# Patient Record
Sex: Female | Born: 1955 | Race: White | Hispanic: No | Marital: Married | Smoking: Never smoker
Health system: Southern US, Community
[De-identification: ages and names within clinical notes are randomized; demographics above are authoritative.]

## PROBLEM LIST (undated history)

## (undated) DIAGNOSIS — I456 Pre-excitation syndrome: Secondary | ICD-10-CM

## (undated) DIAGNOSIS — R450 Nervousness: Secondary | ICD-10-CM

## (undated) DIAGNOSIS — R251 Tremor, unspecified: Secondary | ICD-10-CM

## (undated) DIAGNOSIS — G259 Extrapyramidal and movement disorder, unspecified: Secondary | ICD-10-CM

## (undated) DIAGNOSIS — M21372 Foot drop, left foot: Secondary | ICD-10-CM

## (undated) DIAGNOSIS — F419 Anxiety disorder, unspecified: Secondary | ICD-10-CM

## (undated) HISTORY — DX: Anxiety disorder, unspecified: F41.9

## (undated) HISTORY — DX: Nervousness: R45.0

## (undated) HISTORY — PX: FACIAL COSMETIC SURGERY: SHX629

## (undated) HISTORY — PX: TONSILLECTOMY: SUR1361

## (undated) HISTORY — DX: Pre-excitation syndrome: I45.6

## (undated) HISTORY — DX: Foot drop, left foot: M21.372

## (undated) HISTORY — DX: Extrapyramidal and movement disorder, unspecified: G25.9

## (undated) HISTORY — DX: Tremor, unspecified: R25.1

---

## 1999-12-10 ENCOUNTER — Other Ambulatory Visit: Admission: RE | Admit: 1999-12-10 | Discharge: 1999-12-10 | Payer: Self-pay | Admitting: Gynecology

## 2003-02-20 ENCOUNTER — Other Ambulatory Visit: Admission: RE | Admit: 2003-02-20 | Discharge: 2003-02-20 | Payer: Self-pay | Admitting: Gynecology

## 2004-03-02 ENCOUNTER — Other Ambulatory Visit: Admission: RE | Admit: 2004-03-02 | Discharge: 2004-03-02 | Payer: Self-pay | Admitting: Gynecology

## 2005-03-15 ENCOUNTER — Other Ambulatory Visit: Admission: RE | Admit: 2005-03-15 | Discharge: 2005-03-15 | Payer: Self-pay | Admitting: Gynecology

## 2006-03-28 ENCOUNTER — Other Ambulatory Visit: Admission: RE | Admit: 2006-03-28 | Discharge: 2006-03-28 | Payer: Self-pay | Admitting: Gynecology

## 2007-02-08 ENCOUNTER — Ambulatory Visit: Payer: Self-pay | Admitting: Internal Medicine

## 2007-02-22 ENCOUNTER — Ambulatory Visit: Payer: Self-pay | Admitting: Internal Medicine

## 2011-04-28 ENCOUNTER — Other Ambulatory Visit (HOSPITAL_COMMUNITY)
Admission: RE | Admit: 2011-04-28 | Discharge: 2011-04-28 | Disposition: A | Discharge status: Home / Self Care | Payer: BC Managed Care – PPO | Source: Ambulatory Visit | Attending: Radiology | Admitting: Radiology

## 2011-04-28 DIAGNOSIS — N6009 Solitary cyst of unspecified breast: Secondary | ICD-10-CM | POA: Insufficient documentation

## 2012-06-10 ENCOUNTER — Encounter (HOSPITAL_BASED_OUTPATIENT_CLINIC_OR_DEPARTMENT_OTHER): Payer: Self-pay | Admitting: *Deleted

## 2012-06-10 ENCOUNTER — Emergency Department (HOSPITAL_BASED_OUTPATIENT_CLINIC_OR_DEPARTMENT_OTHER): Payer: BC Managed Care – PPO

## 2012-06-10 ENCOUNTER — Emergency Department (HOSPITAL_BASED_OUTPATIENT_CLINIC_OR_DEPARTMENT_OTHER)
Admission: EM | Admit: 2012-06-10 | Discharge: 2012-06-10 | Disposition: A | Discharge status: Home / Self Care | Payer: BC Managed Care – PPO | Attending: Emergency Medicine | Admitting: Emergency Medicine

## 2012-06-10 DIAGNOSIS — M25529 Pain in unspecified elbow: Secondary | ICD-10-CM

## 2012-06-10 DIAGNOSIS — Z7982 Long term (current) use of aspirin: Secondary | ICD-10-CM | POA: Insufficient documentation

## 2012-06-10 DIAGNOSIS — Y9229 Other specified public building as the place of occurrence of the external cause: Secondary | ICD-10-CM | POA: Insufficient documentation

## 2012-06-10 DIAGNOSIS — S60229A Contusion of unspecified hand, initial encounter: Secondary | ICD-10-CM | POA: Insufficient documentation

## 2012-06-10 DIAGNOSIS — W19XXXA Unspecified fall, initial encounter: Secondary | ICD-10-CM | POA: Insufficient documentation

## 2012-06-10 MED ORDER — SODIUM CHLORIDE 0.9 % IV BOLUS (SEPSIS): 500.0000 mL | Freq: Once | INTRAVENOUS | Status: DC

## 2012-06-10 MED ORDER — OXYCODONE-ACETAMINOPHEN 5-325 MG PO TABS: 1.0000 | ORAL_TABLET | ORAL | Status: AC | PRN

## 2012-06-10 MED ORDER — IBUPROFEN 800 MG PO TABS
800.0000 mg | ORAL_TABLET | Freq: Once | ORAL | Status: AC
  Administered 2012-06-10: 800 mg via ORAL
  Filled 2012-06-10: qty 1

## 2012-06-10 NOTE — ED Notes (Signed)
Pt states she was wearing a different pair of shoes and fell. C/O pain to left elbow and lip. Rings removed and given to husband. Ice pack applied. +radial pulse. Moves fingers. Feels touch. Cap refill < 3 sec

## 2012-06-10 NOTE — ED Provider Notes (Signed)
History   This chart was scribed for Pamela Quarry, MD scribed by Magnus Sinning. The patient was seen in room MH08/MH08 seen at 16:50   CSN: [id number]  Arrival date & time 06/10/12  1606   None     Chief Complaint  Patient presents with  . Fall    (Consider location/radiation/quality/duration/timing/severity/associated sxs/prior treatment) HPI Pamela Khan is a 56 y.o. female who presents to the Emergency Department complaining of persistent moderate left elbow pain, as a result of fall that occurred at [employer] approximately 1 hour ago. She says she was walking when she fell, hitting her chin and mouth. She states she has not taken any medication, besides a Xanax today. Reports normal bite and denies LOC,head injury, chest tenderness, abd tenderness, ambulatory difficulties,or neck pain  History reviewed. No pertinent past medical history.  History reviewed. No pertinent past surgical history.  History reviewed. No pertinent family history.  History  Substance Use Topics  . Smoking status: Never Smoker   . Smokeless tobacco: Not on file  . Alcohol Use: Yes   Review of Systems  HENT: Negative for neck pain.   Cardiovascular: Negative for chest pain.  Gastrointestinal: Negative for abdominal pain.  Musculoskeletal: Negative for gait problem.  Neurological: Negative for headaches.  All other systems reviewed and are negative.    Allergies  Review of patient's allergies indicates no known allergies.  Home Medications   Current Outpatient Rx  Name Route Sig Dispense Refill  . ACETAMINOPHEN 325 MG PO TABS Oral Take 650 mg by mouth every 6 (six) hours as needed. For headache    . ASPIRIN 81 MG PO TABS Oral Take 81 mg by mouth daily.    Marland Kitchen LORAZEPAM PO Oral Take 1 tablet by mouth 2 (two) times daily as needed. For anxiety      BP 123/74  Pulse 84  Temp 98 F (36.7 C) (Oral)  Resp 20  Ht 5\' 5"  (1.651 m)  Wt 130 lb (58.968 kg)  BMI 21.63 kg/m2  SpO2  98%  Physical Exam  Nursing note and vitals reviewed. Constitutional: She is oriented to person, place, and time. She appears well-developed and well-nourished. No distress.  HENT:  Head: Normocephalic and atraumatic.       Tooth not tender over alveolar ridge above it. No hole. Tooth nml. Little abrasion on chin. No blood or trauma behind ears or eardrum. No contusions over mastoid.   Eyes: EOM are normal. Pupils are equal, round, and reactive to light.  Neck: Neck supple. No tracheal deviation present.       Neck Non-tender  Cardiovascular: Normal rate.   Pulmonary/Chest: Effort normal. No respiratory distress. She exhibits no tenderness.  Abdominal: Soft. She exhibits no distension. There is no tenderness.  Musculoskeletal: Normal range of motion. She exhibits no edema.       No tenderness over left elbow. Tenderness spot on distal humerus lateral aspect. Full active ROM of left elbow  Neurological: She is alert and oriented to person, place, and time. No sensory deficit.  Skin: Skin is warm and dry.  Psychiatric: She has a normal mood and affect. Her behavior is normal.    ED Course  Procedures (including critical care time) DIAGNOSTIC STUDIES: Oxygen Saturation is 98% on room air, normal by my interpretation.    COORDINATION OF CARE:  Dg Elbow Complete Left  06/10/2012  *RADIOLOGY REPORT*  Clinical Data: Slipped and fell injuring left elbow and left humerus  LEFT ELBOW -  COMPLETE 3+ VIEW  Comparison: None  Findings: Joint spaces preserved. Osseous mineralization grossly normal. No acute fracture, dislocation or bone destruction. Elbow joint effusion.  IMPRESSION: No acute osseous abnormalities identified.  Original Report Authenticated By: Lollie Marrow, M.D.   Dg Humerus Left  06/10/2012  *RADIOLOGY REPORT*  Clinical Data: Slipped, fell, injuring left elbow and humerus, proximal humeral pain  LEFT HUMERUS - 2+ VIEW  Comparison: No  Findings: AC joint alignment normal. Scattered  artifacts. No definite fracture, dislocation or bone destruction.  IMPRESSION: No acute osseous abnormalities.  Original Report Authenticated By: Lollie Marrow, M.D.     No diagnosis found.    MDM  I personally performed the services described in this documentation, which was scribed in my presence. The recorded information has been reviewed and considered.         Pamela Quarry, MD 06/11/12 [phone]

## 2012-06-10 NOTE — Discharge Instructions (Signed)
Abrasions Abrasions are skin scrapes. Their treatment depends on how large and deep the abrasion is. Abrasions do not extend through all layers of the skin. A cut or lesion through all skin layers is called a laceration. HOME CARE INSTRUCTIONS   If you were given a dressing, change it at least once a day or as instructed by your caregiver. If the bandage sticks, soak it off with a solution of water or hydrogen peroxide.   Twice a day, wash the area with soap and water to remove all the cream/ointment. You may do this in a sink, under a tub faucet, or in a shower. Rinse off the soap and pat dry with a clean towel. Look for signs of infection (see below).   Reapply cream/ointment according to your caregiver's instruction. This will help prevent infection and keep the bandage from sticking. Telfa or gauze over the wound and under the dressing or wrap will also help keep the bandage from sticking.   If the bandage becomes wet, dirty, or develops a foul smell, change it as soon as possible.   Only take over-the-counter or prescription medicines for pain, discomfort, or fever as directed by your caregiver.  SEEK IMMEDIATE MEDICAL CARE IF:   Increasing pain in the wound.   Signs of infection develop: redness, swelling, surrounding area is tender to touch, or pus coming from the wound.   You have a fever.   Any foul smell coming from the wound or dressing.  Most skin wounds heal within ten days. Facial wounds heal faster. However, an infection may occur despite proper treatment. You should have the wound checked for signs of infection within 24 to 48 hours or sooner if problems arise. If you were not given a wound-check appointment, look closely at the wound yourself on the second day for early signs of infection listed above. MAKE SURE YOU:   Understand these instructions.   Will watch your condition.   Will get help right away if you are not doing well or get worse.  Document Released:  09/08/2005 Document Revised: 11/18/2011 Document Reviewed: 11/02/2011 [employer] Patient Information 2012 [employer].Contusion A contusion is a deep bruise. Contusions are the result of an injury that caused bleeding under the skin. The contusion may turn blue, purple, or yellow. Minor injuries will give you a painless contusion, but more severe contusions may stay painful and swollen for a few weeks.  CAUSES  A contusion is usually caused by a blow, trauma, or direct force to an area of the body. SYMPTOMS   Swelling and redness of the injured area.   Bruising of the injured area.   Tenderness and soreness of the injured area.   Pain.  DIAGNOSIS  The diagnosis can be made by taking a history and physical exam. An X-Kamaree Berkel, CT scan, or MRI may be needed to determine if there were any associated injuries, such as fractures. TREATMENT  Specific treatment will depend on what area of the body was injured. In general, the best treatment for a contusion is resting, icing, elevating, and applying cold compresses to the injured area. Over-the-counter medicines may also be recommended for pain control. Ask your caregiver what the best treatment is for your contusion. HOME CARE INSTRUCTIONS   Put ice on the injured area.   Put ice in a plastic bag.   Place a towel between your skin and the bag.   Leave the ice on for 15 to 20 minutes, 3 to 4 times a day.     Only take over-the-counter or prescription medicines for pain, discomfort, or fever as directed by your caregiver. Your caregiver may recommend avoiding anti-inflammatory medicines (aspirin, ibuprofen, and naproxen) for 48 hours because these medicines may increase bruising.   Rest the injured area.   If possible, elevate the injured area to reduce swelling.  SEEK IMMEDIATE MEDICAL CARE IF:   You have increased bruising or swelling.   You have pain that is getting worse.   Your swelling or pain is not relieved with medicines.  MAKE  SURE YOU:   Understand these instructions.   Will watch your condition.   Will get help right away if you are not doing well or get worse.  Document Released: 09/08/2005 Document Revised: 11/18/2011 Document Reviewed: 10/04/2011 Exit[employer] Patient Information 2012 [employer]. 

## 2012-08-04 ENCOUNTER — Encounter: Payer: Self-pay | Admitting: Cardiovascular Disease

## 2012-08-08 ENCOUNTER — Encounter: Payer: Self-pay | Admitting: Cardiovascular Disease

## 2012-08-24 ENCOUNTER — Encounter: Payer: Self-pay | Admitting: *Deleted

## 2012-08-24 ENCOUNTER — Ambulatory Visit (INDEPENDENT_AMBULATORY_CARE_PROVIDER_SITE_OTHER): Payer: BC Managed Care – PPO | Admitting: Cardiovascular Disease

## 2012-08-24 ENCOUNTER — Encounter: Payer: Self-pay | Admitting: Cardiovascular Disease

## 2012-08-24 VITALS — BP 134/79 | HR 69 | Wt 137.0 lb

## 2012-08-24 DIAGNOSIS — R450 Nervousness: Secondary | ICD-10-CM | POA: Insufficient documentation

## 2012-08-24 DIAGNOSIS — F411 Generalized anxiety disorder: Secondary | ICD-10-CM

## 2012-08-24 DIAGNOSIS — F419 Anxiety disorder, unspecified: Secondary | ICD-10-CM | POA: Insufficient documentation

## 2012-08-24 DIAGNOSIS — I456 Pre-excitation syndrome: Secondary | ICD-10-CM | POA: Insufficient documentation

## 2012-08-24 NOTE — Assessment & Plan Note (Signed)
F/U echo to R/O associated structural heart disease.  F/U ETT to see how robust pathway is.  Opinion from EP but since asymptomatic would not Rx.

## 2012-08-24 NOTE — Patient Instructions (Addendum)
Your physician wants you to follow-up in:   YEAR WITH DR Haywood Filler will receive a reminder letter in the mail two months in advance. If you don't receive a letter, please call our office to schedule the follow-up appointment.  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 Please refer to the Current Medication list given to you today. Your physician has requested that you have an exercise tolerance test. For further information please visit [email or web address] Please also follow instruction sheet, as given.  DX WPW Your physician has requested that you have an echocardiogram. Echocardiography is a painless test that uses sound waves to create images of your heart. It provides your doctor with information about the size and shape of your heart and how well your heart's chambers and valves are working. This procedure takes approximately one hour. There are no restrictions for this procedure. DX CHECK HEART STRUCTURE   AND WPW

## 2012-08-24 NOTE — Progress Notes (Signed)
Patient ID: Pamela Khan, female   DOB: [birth year], 56 y.o.   MRN: [id number] 56 yo referred by Dr Riley Nearing for abnormal ECG.  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  Patient does not recall having a prior ECG.  She is asymptomatic with no palpitations or syncope.  No family history.  She walks , uses the elliptical and does zumba with no chest pain or dyspnea  Nonsmoker and has a couple of drinks per week.  History of depression but not on sympathomimetic drug.    ROS: Denies fever, malais, weight loss, blurry vision, decreased visual acuity, cough, sputum, SOB, hemoptysis, pleuritic pain, palpitaitons, heartburn, abdominal pain, melena, lower extremity edema, claudication, or rash.  All other systems reviewed and negative   General: Affect appropriate Healthy:  appears stated age HEENT: normal Neck supple with no adenopathy JVP normal no bruits no thyromegaly Lungs clear with no wheezing and good diaphragmatic motion Heart:  S1/S2 no murmur,rub, gallop or click PMI normal Abdomen: benighn, BS positve, no tenderness, no AAA no bruit.  No HSM or HJR Distal pulses intact with no bruits No edema Neuro non-focal Skin warm and dry No muscular weakness  Medications Current Outpatient Prescriptions  Medication Sig Dispense Refill  . acetaminophen (TYLENOL) 325 MG tablet Take 650 mg by mouth every 6 (six) hours as needed. For headache      . aspirin 81 MG tablet Take 81 mg by mouth daily.      Marland Kitchen FLUoxetine (PROZAC) 10 MG capsule Take 10 mg by mouth daily.       Marland Kitchen LORazepam (ATIVAN) 1 MG tablet Take 1 mg by mouth daily as needed. For anxiety        Allergies Review of patient's allergies indicates no known allergies.  Family History: Family History  Problem Relation Age of Onset  . Mental illness    . Heart disease    . Hyperlipidemia      Social History: History   Social History  . Marital Status: Married    Spouse Name: N/A    Number of  Children: N/A  . Years of Education: N/A   Occupational History  . Not on file.   Social History Main Topics  . Smoking status: Never Smoker   . Smokeless tobacco: Not on file  . Alcohol Use: Yes  . Drug Use: No  . Sexually Active:    Other Topics Concern  . Not on file   Social History Narrative  . 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

## 2012-08-24 NOTE — Assessment & Plan Note (Signed)
PRN xanax.  Avoid antidepressents with sympathomimetic activity

## 2012-08-29 ENCOUNTER — Other Ambulatory Visit (HOSPITAL_COMMUNITY): Payer: BC Managed Care – PPO

## 2012-08-31 ENCOUNTER — Ambulatory Visit (HOSPITAL_COMMUNITY): Payer: BC Managed Care – PPO | Attending: Cardiovascular Disease | Admitting: Radiology

## 2012-08-31 DIAGNOSIS — I369 Nonrheumatic tricuspid valve disorder, unspecified: Secondary | ICD-10-CM | POA: Insufficient documentation

## 2012-08-31 DIAGNOSIS — R9431 Abnormal electrocardiogram [ECG] [EKG]: Secondary | ICD-10-CM

## 2012-08-31 DIAGNOSIS — I456 Pre-excitation syndrome: Secondary | ICD-10-CM

## 2012-08-31 NOTE — Progress Notes (Signed)
Echocardiogram performed.  

## 2012-09-05 ENCOUNTER — Encounter: Payer: BC Managed Care – PPO | Admitting: Physician Assistant

## 2012-09-11 ENCOUNTER — Encounter: Payer: Self-pay | Admitting: Physician Assistant

## 2012-09-11 ENCOUNTER — Ambulatory Visit (INDEPENDENT_AMBULATORY_CARE_PROVIDER_SITE_OTHER): Payer: BC Managed Care – PPO | Admitting: Physician Assistant

## 2012-09-11 DIAGNOSIS — I456 Pre-excitation syndrome: Secondary | ICD-10-CM

## 2012-09-11 NOTE — Procedures (Signed)
Exercise Treadmill Test  Pre-Exercise Testing Evaluation Rhythm: normal sinus  Rate: 64   PR:  .10 QRS:  .10  QT:  .41 QTc: .42     Test  Exercise Tolerance Test Ordering MD: Charlton Haws, MD  Interpreting MD: Tereso Newcomer , PA-C  Unique Test No: 1  Treadmill:  1  Indication for ETT: WPW  Contraindication to ETT: No   Stress Modality: exercise - treadmill  Cardiac Imaging Performed: non   Protocol: standard Bruce - maximal  Max BP:  166/70  Max MPHR (bpm):  164 85% MPR (bpm):  139  MPHR obtained (bpm):  166 % MPHR obtained:  101%  Reached 85% MPHR (min:sec):  7:25 Total Exercise Time (min-sec):  9:46  Workload in METS:  11.3 Borg Scale: 15  Reason ETT Terminated:  patient's desire to stop    ST Segment Analysis At Rest: normal ST segments - no evidence of significant ST depression With Exercise: non-specific ST changes  Other Information Arrhythmia:  No Angina during ETT:  absent (0) Quality of ETT:  diagnostic  ETT Interpretation:  normal - no evidence of ischemia by ST analysis  Comments: Good exercise tolerance. No chest pain. Normal BP response to exercise. No ST-T changes to suggest ischemia.  Delta wave resolves in most leads (where evident) when HR approaches 90-100 or higer.  It did persist in lead 1 (?significance).  Recommendations: Follow up with Dr. Charlton Haws as directed. She has appt with EP pending. Tereso Newcomer, PA-C  12:11 PM 09/11/2012

## 2012-09-20 ENCOUNTER — Institutional Professional Consult (permissible substitution): Payer: BC Managed Care – PPO | Admitting: Cardiology

## 2012-10-02 ENCOUNTER — Ambulatory Visit (INDEPENDENT_AMBULATORY_CARE_PROVIDER_SITE_OTHER): Payer: BC Managed Care – PPO | Admitting: Internal Medicine

## 2012-10-02 ENCOUNTER — Encounter: Payer: Self-pay | Admitting: Internal Medicine

## 2012-10-02 VITALS — BP 108/72 | HR 70 | Ht 65.0 in | Wt 139.0 lb

## 2012-10-02 DIAGNOSIS — I456 Pre-excitation syndrome: Secondary | ICD-10-CM

## 2012-10-02 NOTE — Patient Instructions (Addendum)
Your physician recommends that you schedule a follow-up appointment as needed  

## 2012-10-09 ENCOUNTER — Encounter: Payer: Self-pay | Admitting: Internal Medicine

## 2012-10-09 NOTE — Progress Notes (Signed)
   Primary Care Physician: Angelica Chessman., MD Referring Physician:  Dr Johney Maine is a 56 y.o. female with a h/o recently discovered pre-excitation in EKG who presents today for EP consultation.  She has been evaluated by Dr Eden Emms.  She underwent a GXT which revealed that her pre-excitation resolved with exercise (HR over 100 bpm).  She exercises regularly without difficulty. Today, she denies symptoms of palpitations, chest pain, shortness of breath, orthopnea, PND, lower extremity edema, dizziness, presyncope, syncope, or neurologic sequela. The patient is tolerating medications without difficulties and is otherwise without complaint today.   Past Medical History  Diagnosis Date  . Nervous   . Anxiety   . Ventricular pre-excitation    Past Surgical History  Procedure Date  . Tonsillectomy   . Facial cosmetic surgery   . Cesarean section     Current Outpatient Prescriptions  Medication Sig Dispense Refill  . acetaminophen (TYLENOL) 325 MG tablet Take 650 mg by mouth every 6 (six) hours as needed. For headache      . aspirin 81 MG tablet Take 81 mg by mouth daily.      Marland Kitchen FLUoxetine (PROZAC) 10 MG capsule Take 10 mg by mouth daily.       Marland Kitchen LORazepam (ATIVAN) 1 MG tablet Take 1 mg by mouth daily as needed. For anxiety        No Known Allergies  History   Social History  . Marital Status: Married    Spouse Name: N/A    Number of Children: N/A  . Years of Education: N/A   Occupational History  . Not on file.   Social History Main Topics  . Smoking status: Never Smoker   . Smokeless tobacco: Not on file  . Alcohol Use: Yes  . Drug Use: No  . Sexually Active:    Other Topics Concern  . Not on file   Social History Narrative  . No narrative on file    Family History  Problem Relation Age of Onset  . Mental illness    . Heart disease    . Hyperlipidemia      ROS- All systems are reviewed and negative except as per the HPI above  Physical  Exam: Filed Vitals:   10/02/12 1550  BP: 108/72  Pulse: 70  Height: 5\' 5"  (1.651 m)  Weight: 139 lb (63.05 kg)  SpO2: 99%    GEN- The patient is well appearing, alert and oriented x 3 today.   Head- normocephalic, atraumatic Eyes-  Sclera clear, conjunctiva pink Ears- hearing intact Oropharynx- clear Neck- supple, no JVP Lymph- no cervical lymphadenopathy Lungs- Clear to ausculation bilaterally, normal work of breathing Heart- Regular rate and rhythm, no murmurs, rubs or gallops, PMI not laterally displaced GI- soft, NT, ND, + BS Extremities- no clubbing, cyanosis, or edema MS- no significant deformity or atrophy Skin- no rash or lesion Psych- euthymic mood, full affect Neuro- strength and sensation are intact  EKG 08/04/12- sinus rhythm with right paraseptal or more lateral pathway  Assessment and Plan:

## 2012-10-09 NOTE — Assessment & Plan Note (Signed)
The patient has pre-excitation by ekg but without symptoms of tachycardia/ palpitations, or documented arrhythmia.  By ekg, she has a right sided pathway (either posteroseptal or inferolateral).  As her pre-excitation resolves with exercise (suggesting poorly conducting pathway) and she has no symptoms, I think that watchful waiting is the most appropriate strategy at this time.  If she develops symptoms of tachycardia or syncope then EP study would certainly be a reasonable option at that time.

## 2015-01-17 ENCOUNTER — Encounter (HOSPITAL_BASED_OUTPATIENT_CLINIC_OR_DEPARTMENT_OTHER): Payer: Self-pay

## 2015-01-17 ENCOUNTER — Emergency Department (HOSPITAL_BASED_OUTPATIENT_CLINIC_OR_DEPARTMENT_OTHER)
Admission: EM | Admit: 2015-01-17 | Discharge: 2015-01-17 | Disposition: A | Discharge status: Home / Self Care | Payer: BLUE CROSS/BLUE SHIELD | Attending: Emergency Medicine | Admitting: Emergency Medicine

## 2015-01-17 DIAGNOSIS — M791 Myalgia: Secondary | ICD-10-CM | POA: Insufficient documentation

## 2015-01-17 DIAGNOSIS — Z79899 Other long term (current) drug therapy: Secondary | ICD-10-CM | POA: Insufficient documentation

## 2015-01-17 DIAGNOSIS — Z8679 Personal history of other diseases of the circulatory system: Secondary | ICD-10-CM | POA: Diagnosis not present

## 2015-01-17 DIAGNOSIS — Z7982 Long term (current) use of aspirin: Secondary | ICD-10-CM | POA: Insufficient documentation

## 2015-01-17 DIAGNOSIS — F419 Anxiety disorder, unspecified: Secondary | ICD-10-CM | POA: Insufficient documentation

## 2015-01-17 DIAGNOSIS — M79605 Pain in left leg: Secondary | ICD-10-CM | POA: Insufficient documentation

## 2015-01-17 MED ORDER — IBUPROFEN 600 MG PO TABS: 600.0000 mg | ORAL_TABLET | Freq: Four times a day (QID) | ORAL | Status: DC | PRN

## 2015-01-17 MED ORDER — PREDNISONE 20 MG PO TABS: ORAL_TABLET | ORAL | Status: DC

## 2015-01-17 MED ORDER — CYCLOBENZAPRINE HCL 10 MG PO TABS: 10.0000 mg | ORAL_TABLET | Freq: Two times a day (BID) | ORAL | Status: DC | PRN

## 2015-01-17 NOTE — ED Notes (Signed)
Pt reports 2 week history of a "heavy" feeling in her left thigh area - reports heaviness from left hip down to left knee. Pt tearful in triage and states she has been under a lot of stress.

## 2015-01-17 NOTE — ED Provider Notes (Signed)
CSN: [id number]     Arrival date & time 01/17/15  1743 History   First MD Initiated Contact with Patient 01/17/15 1759     Chief Complaint  Patient presents with  . Leg Pain     (Consider location/radiation/quality/duration/timing/severity/associated sxs/prior Treatment) HPI Pt is a 59yo female presenting to ED with c/o gradually worsening left hip and thigh pain that is aching, and feels "heavy", worse with ambulation and while sitting in a chair but improved while lying down. Pain is 7/10 at worst. Denies numbness with the pain.  No relief with OTC medication. Pt states she is worried something bad is causing her pain. Pt does report being under a lot of stress. Denies fever, chills, n/v/d. Denies abdominal pain or back pain.  Denies recent falls, heavy lifting or known injury. No hx of same. Denies hx of blood clot. No recent travel or surgery. Not on estrogen supplements.   Past Medical History  Diagnosis Date  . Nervous   . Anxiety   . Ventricular pre-excitation    Past Surgical History  Procedure Laterality Date  . Tonsillectomy    . Facial cosmetic surgery    . Cesarean section     Family History  Problem Relation Age of Onset  . Mental illness    . Heart disease    . Hyperlipidemia     History  Substance Use Topics  . Smoking status: Never Smoker   . Smokeless tobacco: Not on file  . Alcohol Use: Yes     Comment: occasional   OB History    No data available     Review of Systems  Constitutional: Negative for fever, chills and fatigue.  Gastrointestinal: Negative for nausea, vomiting and diarrhea.  Musculoskeletal: Positive for myalgias and arthralgias. Negative for back pain and joint swelling.       Left hip, left leg  Skin: Negative for color change and wound.  Neurological: Negative for weakness and numbness.        "heaviness" in left leg  Psychiatric/Behavioral: The patient is nervous/anxious.   All other systems reviewed and are  negative.     Allergies  Review of patient's allergies indicates no known allergies.  Home Medications   Prior to Admission medications   Medication Sig Start Date End Date Taking? Authorizing Provider  acetaminophen (TYLENOL) 325 MG tablet Take 650 mg by mouth every 6 (six) hours as needed. For headache   Yes Historical Provider, MD  aspirin 81 MG tablet Take 81 mg by mouth daily.   Yes Historical Provider, MD  LORazepam (ATIVAN) 1 MG tablet Take 1 mg by mouth daily as needed. For anxiety   Yes Historical Provider, MD  cyclobenzaprine (FLEXERIL) 10 MG tablet Take 1 tablet (10 mg total) by mouth 2 (two) times daily as needed for muscle spasms. 01/17/15   Junius Finner, PA-C  FLUoxetine (PROZAC) 10 MG capsule Take 10 mg by mouth daily.  08/04/12   Historical Provider, MD  ibuprofen (ADVIL,MOTRIN) 600 MG tablet Take 1 tablet (600 mg total) by mouth every 6 (six) hours as needed. 01/17/15   Junius Finner, PA-C  predniSONE (DELTASONE) 20 MG tablet 3 tabs po day one, then 2 po daily x 4 days 01/17/15   Junius Finner, PA-C   BP 168/82 mmHg  Pulse 82  Temp(Src) 98.2 F (36.8 C) (Oral)  Resp 18  Ht  (1.651 m)  Wt 130 lb (58.968 kg)  BMI 21.63 kg/m2  SpO2 98% Physical Exam  Constitutional:  She is oriented to person, place, and time. She appears well-developed and well-nourished.  HENT:  Head: Normocephalic and atraumatic.  Eyes: EOM are normal.  Neck: Normal range of motion.  Cardiovascular: Normal rate.   Pulmonary/Chest: Effort normal.  Musculoskeletal: Normal range of motion. She exhibits tenderness. She exhibits no edema.  Left hip: FROM, no crepitus. Mild tenderness to left buttock. No thigh, knee, or calf tenderness. FROM left knee and ankle. Antalgic gait.  Neurological: She is alert and oriented to person, place, and time.  Skin: Skin is warm and dry. No erythema.  Skin in tact. No ecchymosis or erythema.   Psychiatric: She has a normal mood and affect. Her behavior is normal.   Nursing note and vitals reviewed.   ED Course  Procedures (including critical care time) Labs Review Labs Reviewed - No data to display  Imaging Review No results found.   EKG Interpretation None      MDM   Final diagnoses:  Left leg pain    Pt presenting to ED with left hip and upper leg pain.  No evidence of underlying infection. Doubt DVT.  Symptoms most consistent with muscular strain vs sciatic.  No hx of trauma, no red flag symptoms. Do not believe imaging indicated at this time. Not concerned for cellulitis, cauda equina, septic joint, or other emergent process taking place at this time. Will tx conservatively with home exercises, flexeril, motrin, and prednisone. Home care instructions provided. Advised to f/u with PCP for recheck of symptoms in 1 week if not improving. Also advised to f/u with orthopedist, Dr. Pearletha ForgeHudnall, if not improving. Return precautions provided. Pt verbalized understanding and agreement with tx plan.     Junius Finnerrin O'Malley, PA-C 01/18/15 1942  Rolan BuccoMelanie Belfi, MD 01/18/15 [phone]

## 2016-09-14 ENCOUNTER — Ambulatory Visit: Payer: BLUE CROSS/BLUE SHIELD | Attending: Orthopedic Surgery | Admitting: Physical Therapy

## 2016-09-14 DIAGNOSIS — M25512 Pain in left shoulder: Secondary | ICD-10-CM | POA: Insufficient documentation

## 2016-09-14 DIAGNOSIS — M25612 Stiffness of left shoulder, not elsewhere classified: Secondary | ICD-10-CM | POA: Insufficient documentation

## 2016-09-14 DIAGNOSIS — R262 Difficulty in walking, not elsewhere classified: Secondary | ICD-10-CM | POA: Diagnosis present

## 2016-09-14 DIAGNOSIS — M25552 Pain in left hip: Secondary | ICD-10-CM | POA: Insufficient documentation

## 2016-09-15 NOTE — Therapy (Signed)
[facility] [place] [address]  [address] [place], [place], [address] Phone: [phone]   Fax:  [phone]  Physical Therapy Evaluation  Patient Details  Name: Pamela Khan MRN: [id number] Date of Birth: 10-10-1956 Referring Provider: Jonette PesaBrian J. Swinteck, MD  Encounter Date: 09/14/2016      PT End of Session - 09/14/16 1748    Visit Number 1   Number of Visits 16   Date for PT Re-Evaluation 11/19/16   Authorization Type BCBS   Authorization - Number of Visits 60   PT Start Time 1658   PT Stop Time 1745   PT Time Calculation (min) 47 min   Activity Tolerance Patient tolerated treatment well   Behavior [facility] Therapy [facility] for tasks assessed/performed      Past Medical History:  Diagnosis Date  . Anxiety   . Nervous   . Ventricular pre-excitation     Past Surgical History:  Procedure Laterality Date  . CESAREAN SECTION    . FACIAL COSMETIC SURGERY    . TONSILLECTOMY      There were no vitals filed for this visit.       Subjective Assessment - 09/14/16 1700    Subjective Pt reports pain at hip has been an issue since Jan 2016. Initially thought to be sciatic in nature, but MRI negative and limited response to PT for low back. Pt states pain now thought to be tendinits or bursitis. Pain primarily in lateral hip but also notes pain in lower buttock and medial thigh at times. Also reports noting the L LE will shake at times.   Patient Stated Goals "Walk without pain and stop my leg from shaking"   Currently in Pain? Yes   Pain Score 2   Least 0/10, Avg 3/10, Worst 3/10   Pain Location Hip   Pain Orientation Left   Pain Descriptors / Indicators Tender   Pain Type Chronic pain   Pain Radiating Towards down lateral thigh to knee   Pain Onset More than a month ago   Pain Frequency Intermittent   Aggravating Factors  prolonged walking   Pain Relieving Factors yoga/stretching   Effect of Pain on Daily Activities walks  with limp            [facility] - 09/14/16 1700      Assessment   Medical Diagnosis L Hip trochanteric bursitis   Referring Provider Jonette PesaBrian J. Swinteck, MD   Onset Date/Surgical Date --  Jan 2016   Next MD Visit 09/30/16   Prior Therapy OP PT 5-6 visits earlier this year for sciatica     Balance Screen   Has the patient fallen in the past 6 months No   Has the patient had a decrease in activity level because of a fear of falling?  No   Is the patient reluctant to leave their home because of a fear of falling?  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   Leisure Spending time with grandkids, had to stop yoga due to L shoulder pain     Observation/Other Assessments  Focus on Therapeutic Outcomes (FOTO)  Hip 62% (38% limitation); predicted 66% (34% limitation)     ROM / Strength   AROM / PROM / Strength AROM;Strength     AROM   Overall AROM  Within functional limits for tasks performed   AROM Assessment Site Hip     Strength   Strength Assessment Site Hip;Knee   Right/Left Hip Left;Right   Right Hip Flexion 4+/5   Right Hip Extension 4+/5   Right Hip External Rotation  4/5   Right Hip Internal Rotation 4/5   Right Hip ABduction 4+/5   Right Hip ADduction 4+/5   Left Hip Flexion 4/5   Left Hip Extension 4/5   Left Hip External Rotation 4/5   Left Hip Internal Rotation 4-/5   Left Hip ABduction 4-/5   Left Hip ADduction 4-/5   Right/Left Knee Left;Right   Right Knee Flexion 5/5   Right Knee Extension 4+/5   Left Knee Flexion 4/5   Left Knee Extension 4/5     Flexibility   Soft Tissue Assessment /Muscle Length yes   Hamstrings mildly  tight L>R   Quadriceps mildly tight quad/RF L>R   ITB tight on L   Piriformis mildly tight L>R     Palpation   Palpation comment ttp over L greater trochanter & L piriformis     Special Tests    Special Tests Hip Special Tests   Hip Special Tests  Pamela Khan (FABER) Test;Ober's Test     Pamela Khan [facility]) Test   Findings Positive   Side Left     Ober's Test   Findings Positive   Side Left         Today's Treatment  TherEx HS stretch with strap x30" ITB stretch with strap x30" Figure 4 piriformis stretch with strap x30" KTOS piriformis stretch x30" Hooklying Alt Hip ABD/ER with green TB 10x3" Bridge + Hip ABD isometric with green TB 10x3"          PT Education - 09/14/16 1745    Education provided Yes   Education Details PT eval findings, POC and inital HEP   Person(s) Educated Patient   Methods Explanation;Demonstration;Handout   Comprehension Verbalized understanding;Returned demonstration;Need further instruction          PT Short Term Goals - 09/14/16 1745      PT SHORT TERM GOAL #1   Title Independent with initial HEP by 10/01/16   Status New           PT Long Term Goals - 09/14/16 1745      PT LONG TERM GOAL #1   Title Independent with advanced HEP as indicated by 11/19/16   Status New     PT LONG TERM GOAL #2   Title B hip strength >/= 4+/5 for improved function by 11/19/16   Status New     PT LONG TERM GOAL #3   Title Pt will walk w/o limp or increased L hip pain by 11/19/16   Status New               Plan - 09/14/16 1745    Clinical Impression Statement Pamela Khan is a 60 y/o female who present to OP PT with h/o L lateral hip pain consistent with trochanteric bursitis. Pt reports onset of pain originating in January 2016, and states pain was originally thought to be sciatic in nature but lumbar MRI was negative and PT for low back only gave limited relief. Pt reports pain primarily in lateral  hip, but also notes pain in lower buttock and  medial thigh at times. Pt reports she had to be on her feet more with increased walking last week for work and now feels like she has to limp while walking. Pt also feels that pain causes her leg to shake at times (witnessed during flexibility assessment and instruction in stretches during eval), which embarrasses her. Pain reported at time of eval was 2/10, but pt reports times where she has no pain and states average/worst pain typically 3/10. Flexibility assessment reveals mild to moderate proximal LE tightness in B ITB, piriformis, hamstrings and quads especially RF. Pt ttp over L greater trochanter and to a lesser degree over L piriformis. Proximal LE strength assessment reveals mild to moderate weakness in L hip vs R (refer to above MMT). POC will focus on improving LE soft tissue pliability along with core/proximal stability training, LE strengthening and manual therapy/modalities including iontophoresis if approved by MD, PRN for pain. Pt also presents to PT with second referral for adhesive capsulitis from another MD, therefore will complete eval for this at next visit.   Rehab Potential Good   Clinical Impairments Affecting Rehab Potential L shoulder adhesive capsulitis   PT Frequency 2x / week   PT Duration 8 weeks   PT Treatment/Interventions Patient/family education;Therapeutic exercise;Neuromuscular re-education;Manual techniques;Dry needling;Taping;Electrical Stimulation;Moist Heat;Ultrasound;Iontophoresis 4mg /ml Dexamethasone;Cryotherapy;Vasopneumatic Device;Therapeutic activities;Functional mobility training;Gait training;ADLs/Self Care Home Management   PT Next Visit Plan Eval for L shoulder adhesive capsulitis (separate referral); review initial HEP for hip as time allows   Consulted and Agree with Plan of Care Patient      Patient will benefit from skilled therapeutic intervention in order to improve the following deficits and impairments:  Pain, Impaired flexibility, Decreased range  of motion, Decreased strength, Decreased activity tolerance, Difficulty walking  Visit Diagnosis: Pain in left hip  Difficulty in walking, not elsewhere classified     Problem List Patient Active Problem List   Diagnosis Date Noted  . Ventricular pre-excitation 08/24/2012  . Nervous   . Anxiety     Marry Guan, PT, MPT 09/15/2016, 8:52 AM  [facility] [address]  [address] [place], [place], [address] Phone: [phone]   Fax:  [phone]  Name: Pamela Khan MRN: [id number] Date of Birth: 1956/02/07

## 2016-09-21 ENCOUNTER — Ambulatory Visit: Payer: BLUE CROSS/BLUE SHIELD | Admitting: Physical Therapy

## 2016-09-21 DIAGNOSIS — M25552 Pain in left hip: Secondary | ICD-10-CM

## 2016-09-21 DIAGNOSIS — M25612 Stiffness of left shoulder, not elsewhere classified: Secondary | ICD-10-CM

## 2016-09-21 DIAGNOSIS — M25512 Pain in left shoulder: Secondary | ICD-10-CM

## 2016-09-21 DIAGNOSIS — R262 Difficulty in walking, not elsewhere classified: Secondary | ICD-10-CM

## 2016-09-21 NOTE — Therapy (Signed)
[facility] [address]  [address] [place], [place], [address] Phone: [phone]   Fax:  [phone]  Physical Therapy Evaluation  Patient Details  Name: Pamela Khan MRN: [id number] Date of Birth: May 16, 1956 Referring Provider: Vania Rea. Supple, MD  (shoulder)  Encounter Date: 09/21/2016      PT End of Session - 09/21/16 1145    Visit Number 2   Number of Visits 16   Date for PT Re-Evaluation 11/19/16   Authorization Type BCBS   Authorization - Number of Visits 60   PT Start Time 1101   PT Stop Time 1145   PT Time Calculation (min) 44 min   Activity Tolerance Patient tolerated treatment well   Behavior During Therapy WFL for tasks assessed/performed      Past Medical History:  Diagnosis Date  . Anxiety   . Nervous   . Ventricular pre-excitation     Past Surgical History:  Procedure Laterality Date  . CESAREAN SECTION    . FACIAL COSMETIC SURGERY    . TONSILLECTOMY      There were no vitals filed for this visit.       Subjective Assessment - 09/21/16 1105    Subjective Pt reports L shoulder issues originated when she woke up one morning ~ 6 wks ago unable to move L arm. Denies known trauma or injury. Has had some improvement after injection to L shoudler on 09/01/16 but still notes limited motion, esp in IR.   Currently in Pain? Yes   Pain Score 0-No pain   Pain Location Hip   Pain Orientation Left   Multiple Pain Sites Yes   Pain Score 0  no pain at rest; up to 8/10 with motion of L shoudler   Pain Location Shoulder   Pain Orientation Left;Lateral   Pain Descriptors / Indicators --  "excrutiating"   Pain Type Acute pain   Pain Onset More than a month ago   Pain Frequency Intermittent   Aggravating Factors  raising L arm overhead or behind back (no pain reported w/ lifting)   Pain Relieving Factors rest   Effect of Pain on Daily Activities difficulty with bathing, dressing and grooming             [facility] PT Assessment - 09/21/16 1101      Assessment   Medical Diagnosis L shoulder adhesive capsulitis   Referring Provider Vania Rea. 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   Leisure Spending time with grandkids, had to stop yoga due to L shoulder pain     Posture/Postural Control   Posture/Postural Control Postural limitations   Postural Limitations Rounded Shoulders   Posture Comments slightly protracted L scapula      ROM / Strength   AROM / PROM / Strength AROM;PROM;Strength     AROM   AROM Assessment Site Shoulder   Right/Left Shoulder Left;Right   Right Shoulder Flexion 140 Degrees   Right Shoulder ABduction 141 Degrees   Right Shoulder Internal Rotation 82 Degrees   Right Shoulder External Rotation 69 Degrees   Left Shoulder Flexion 105 Degrees  pain in upper ROM   Left Shoulder ABduction 100 Degrees  pain in upper 1/2 ROM   Left Shoulder Internal Rotation 41 Degrees   Left Shoulder External Rotation 64 Degrees     PROM   PROM Assessment Site Shoulder   Right/Left Shoulder Left   Left Shoulder Flexion 116 Degrees   Left Shoulder ABduction 130 Degrees   Left Shoulder Internal Rotation 52 Degrees   Left Shoulder External Rotation 65 Degrees     Strength   Overall Strength Comments L shoulder strength tested w/in available range   Strength Assessment Site Shoulder   Right/Left Shoulder Left;Right   Right Shoulder Flexion 4+/5   Right Shoulder ABduction 4+/5   Right Shoulder Internal Rotation 4+/5   Right Shoulder External Rotation 4+/5   Left Shoulder Flexion 4/5   Left Shoulder ABduction 4/5   Left Shoulder Internal Rotation 4-/5   Left Shoulder External Rotation 4-/5     Palpation   Palpation comment ttp  over lateral shoulder & RTC         Today's Treatment  TherEx Supine L shoulder AAROM Flexion with wand 10x5" Supine L shoulder AAROM ER with wand 10x5" Seated L shoulder AAROM ER with wand 10x5" Standing L shoulder AAROM Abduction with wand 10x5" Standing L shoulder AAROM IR with wand 10x5" (2 options demonstrated)  The following stretches were reviewed from the initial hip HEP at the pt's request: Figure 4 piriformis stretch with strap x30" KTOS piriformis stretch x30"          PT Education - 09/21/16 1145    Education provided Yes   Education Details Shoulder eval findings, POC and initial HEP   Person(s) Educated Patient   Methods Explanation;Demonstration;Handout   Comprehension Verbalized understanding;Returned demonstration;Need further instruction          PT Short Term Goals - 09/21/16 1145      PT SHORT TERM GOAL #1   Title Independent with initial HEPs by 10/01/16   Status On-going           PT Long Term Goals - 09/21/16 1145      PT LONG TERM GOAL #1   Title Independent with advanced HEP as indicated by 11/19/16   Status On-going     PT LONG TERM GOAL #2   Title B hip strength >/= 4+/5 for improved function by 11/19/16   Status On-going     PT LONG TERM GOAL #3   Title Pt will walk w/o limp or increased L hip pain by 11/19/16   Status On-going     PT LONG TERM GOAL #4   Title L shoulder ROM within 10 degrees of R w/o pain by 11/19/16   Status New     PT LONG TERM GOAL #5   Title L shoulder strength >/= 4+/5 for improved functional UE use by 11/19/16   Status New     PT LONG TERM GOAL #6   Title Pt will report ability to dress upper body and perform daily household chores w/o limitation due to L shoulder ROM/pain by 11/19/16   Status New               Plan - 09/21/16 1145    Clinical Impression Statement Pamela Khan is a 60 y/o female who was seen for a secondary PT eval for L shoulder adhesive capsulitis (PT initiated last week for L  hip trochanteric bursitis from referral by Dr. Linna Caprice). Pt reports onset of L shoulder problems ~6 wks ago when she woke up unable to raise or move L shoulder  without significant pain. No known precipitating injury or trauma noted. Pt had injection to L shoulder on 09/01/16 with some relief but still noting limited motion with pain at end ranges of motion, limiting daily activities including household chores and self-care. Pt denies pain at rest, but states pain up to 8/10 with motion of L shoulder. Pain impacting L shoulder AROM in all planes with pain through upper range of L shoulder flexion and abduction and end range IR & ER. Pt notes some ttp over lateral shoulder and upper arm. Postural assessment reveals forward rounded shoulders with protracted L scapula. Only mild strength deficits noted within available range for L shoulder vs R (refer to above MMT). L shoulder POC will focus on improving postural awareness including increasing muscle flexibility along with soft tissue pliability, scapular strengthening/stabilization, gentle L shoulder ROM and strengthening, and manual therapy and modalities PRN for pain. Given previous referral for L trochanteric bursitis, will plan to focus 1 visit each per week on L shoulder and L hip, with adjustments to be made PRN based on progress and continued pain or functional limitations.   Rehab Potential Good   PT Frequency 2x / week   PT Duration 8 weeks   PT Treatment/Interventions Patient/family education;Therapeutic exercise;Neuromuscular re-education;Manual techniques;Dry needling;Taping;Electrical Stimulation;Moist Heat;Ultrasound;Iontophoresis 4mg /ml Dexamethasone;Cryotherapy;Vasopneumatic Device;Therapeutic activities;Functional mobility training;Gait training;ADLs/Self Care Home Management   PT Next Visit Plan Review initial HEPs for hip and shoulder; Core/hip strengthening; Postural training; L shoulder ROM & strengthening; Scapular stabilization - Plan for  alternating days focus on L hip and L shoulder with adjustments made based on pain/functional limitation reports and progress demonstrated   Consulted and Agree with Plan of Care Patient      Patient will benefit from skilled therapeutic intervention in order to improve the following deficits and impairments:  Pain, Impaired flexibility, Decreased range of motion, Decreased strength, Decreased activity tolerance, Difficulty walking  Visit Diagnosis: Pain in left hip - Plan: PT plan of care cert/re-cert  Difficulty in walking, not elsewhere classified - Plan: PT plan of care cert/re-cert  Acute pain of left shoulder - Plan: PT plan of care cert/re-cert  Stiffness of left shoulder, not elsewhere classified - Plan: PT plan of care cert/re-cert     Problem List Patient Active Problem List   Diagnosis Date Noted  . Ventricular pre-excitation 08/24/2012  . Nervous   . Anxiety     Marry GuanJoAnne M Kathee Tumlin, PT, MPT 09/21/2016, 6:07 PM  [facility] [place] [address]  [address] [place], [place], [address] Phone: [phone]   Fax:  [phone]  Name: Cala BradfordLynn C Hammersmith MRN: [id number] Date of Birth: 11-08-1956

## 2016-09-27 ENCOUNTER — Ambulatory Visit: Payer: BLUE CROSS/BLUE SHIELD

## 2016-09-27 DIAGNOSIS — R262 Difficulty in walking, not elsewhere classified: Secondary | ICD-10-CM

## 2016-09-27 DIAGNOSIS — M25552 Pain in left hip: Secondary | ICD-10-CM | POA: Diagnosis not present

## 2016-09-27 DIAGNOSIS — M25612 Stiffness of left shoulder, not elsewhere classified: Secondary | ICD-10-CM

## 2016-09-27 DIAGNOSIS — M25512 Pain in left shoulder: Secondary | ICD-10-CM

## 2016-09-27 NOTE — Therapy (Signed)
[facility] [place] [address]  [address] [place], [place], [address] Phone: [phone]   Fax:  [phone]  Physical Therapy Treatment  Patient Details  Name: Pamela Khan MRN: [id number] Date of Birth: 03-17-1956 Referring Provider: Vania ReaKevin M. Supple, MD  (shoulder)  Encounter Date: 09/27/2016      PT End of Session - 09/27/16 1701    Visit Number 3   Number of Visits 16   Date for PT Re-Evaluation 11/19/16   Authorization Type BCBS   Authorization - Number of Visits 60   PT Start Time 1617   PT Stop Time 1700   PT Time Calculation (min) 43 min   Activity Tolerance Patient tolerated treatment well   Behavior During Therapy WFL for tasks assessed/performed      Past Medical History:  Diagnosis Date  . Anxiety   . Nervous   . Ventricular pre-excitation     Past Surgical History:  Procedure Laterality Date  . CESAREAN SECTION    . FACIAL COSMETIC SURGERY    . TONSILLECTOMY      There were no vitals filed for this visit.      Subjective Assessment - 09/27/16 1622    Subjective Pt. reporting a 6/10 L calf pain currently reporting she injured it while walking fast trying to catch a plane flight yesterday.    Patient Stated Goals "Walk without pain and stop my leg from shaking"   Currently in Pain? Yes   Pain Score 6    Pain Location Calf   Pain Orientation Anterior        Today's Treatment  HEP review:   Supine L shoulder AAROM Flexion with wand 10 x 5" Supine L shoulder AAROM ER with wand 10 x 5" Seated L shoulder AAROM ER with wand 10 x 5" Standing L shoulder AAROM Abduction with wand 10 x 5" Standing L shoulder AAROM IR with wand 10 x 5" (2 options demonstrated); 5/10 dull ache  HS stretch with strap x 30" ITB stretch with strap x 30" Figure 4 piriformis stretch with strap x 30" KTOS piriformis stretch x 30" Hooklying Alt Hip ABD/ER with green TB 10 x 3" Bridge + Hip ABD isometric with green TB  10 x 3"        PT Short Term Goals - 09/21/16 1145      PT SHORT TERM GOAL #1   Title Independent with initial HEPs by 10/01/16   Status On-going           PT Long Term Goals - 09/21/16 1145      PT LONG TERM GOAL #1   Title Independent with advanced HEP as indicated by 11/19/16   Status On-going     PT LONG TERM GOAL #2   Title B hip strength >/= 4+/5 for improved function by 11/19/16   Status On-going     PT LONG TERM GOAL #3   Title Pt will walk w/o limp or increased L hip pain by 11/19/16   Status On-going     PT LONG TERM GOAL #4   Title L shoulder ROM within 10 degrees of R w/o pain by 11/19/16   Status New     PT LONG TERM GOAL #5   Title L shoulder strength >/= 4+/5 for improved functional UE use by 11/19/16   Status New     PT LONG TERM GOAL #6   Title Pt will report ability to dress upper body and  perform daily household chores w/o limitation due to L shoulder ROM/pain by 11/19/16   Status New               Plan - 09/27/16 1831    Clinical Impression Statement     Pt. seen initially today with new complaint of anterior L calf pain following, "walking fast while trying to catch a flight".  Pt. did not report calf pain with treatment today.  Today's treatment focused on HEP review of both hip and shoulder HEP activities.  Pt. demonstrating good overall technique however with L LE muscular guarding and resting tremor with LE stretching today.  Pt. demonstrating difficulty, "releasing" LE with stretching.  Will plan to continue addressing L hip and shoulder deficits to maximize function.     PT Treatment/Interventions Patient/family education;Therapeutic exercise;Neuromuscular re-education;Manual techniques;Dry needling;Taping;Electrical Stimulation;Moist Heat;Ultrasound;Iontophoresis 4mg /ml Dexamethasone;Cryotherapy;Vasopneumatic Device;Therapeutic activities;Functional mobility training;Gait training;ADLs/Self Care Home Management   PT Next Visit Plan Core/hip  strengthening; Postural training; L shoulder ROM & strengthening; Scapular stabilization - Plan for alternating days focus on L hip and L shoulder with adjustments made based on pain/functional limitation reports and progress demonstrated      Patient will benefit from skilled therapeutic intervention in order to improve the following deficits and impairments:  Pain, Impaired flexibility, Decreased range of motion, Decreased strength, Decreased activity tolerance, Difficulty walking  Visit Diagnosis: Pain in left hip  Difficulty in walking, not elsewhere classified  Acute pain of left shoulder  Stiffness of left shoulder, not elsewhere classified     Problem List Patient Active Problem List   Diagnosis Date Noted  . Ventricular pre-excitation 08/24/2012  . Nervous   . Anxiety    Kermit Balo, PTA 09/27/16 6:42 PM  [facility] [address]  [address] [place], [place], [address] Phone: [phone]   Fax:  [phone]  Name: Pamela Khan MRN: [id number] Date of Birth: 09-27-1956

## 2016-10-07 ENCOUNTER — Ambulatory Visit: Payer: BLUE CROSS/BLUE SHIELD

## 2016-10-07 DIAGNOSIS — M25512 Pain in left shoulder: Secondary | ICD-10-CM

## 2016-10-07 DIAGNOSIS — M25612 Stiffness of left shoulder, not elsewhere classified: Secondary | ICD-10-CM

## 2016-10-07 DIAGNOSIS — R262 Difficulty in walking, not elsewhere classified: Secondary | ICD-10-CM

## 2016-10-07 DIAGNOSIS — M25552 Pain in left hip: Secondary | ICD-10-CM | POA: Diagnosis not present

## 2016-10-07 NOTE — Therapy (Signed)
[facility] [place] [facility] [address]  [place], [place], [address] Phone: [phone]   Fax:  [phone]  Physical Therapy Treatment  Patient Details  Name: Pamela Khan MRN: [id number] Date of Birth: Jul 13, 1956 Referring Provider: Bertram Savin, MD   Encounter Date: 10/07/2016      PT End of Session - 10/07/16 1711    Visit Number 4   Number of Visits 16   Date for PT Re-Evaluation 11/19/16   Authorization Type BCBS   Authorization - Number of Visits 60   PT Start Time 1703   PT Stop Time 1750   PT Time Calculation (min) 47 min   Activity Tolerance Patient tolerated treatment well   Behavior During Therapy [facility] for tasks assessed/performed      Past Medical History:  Diagnosis Date  . Anxiety   . Nervous   . Ventricular pre-excitation     Past Surgical History:  Procedure Laterality Date  . CESAREAN SECTION    . FACIAL COSMETIC SURGERY    . TONSILLECTOMY      There were no vitals filed for this visit.      Subjective Assessment - 10/07/16 1707    Subjective Pt. reporting the f/u with MD regarding her shoulder went well    Patient Stated Goals "Walk without pain and stop my leg from shaking"   Currently in Pain? No/denies   Pain Score 0-No pain   Multiple Pain Sites No            [facility] PT Assessment - 10/07/16 1708      Assessment   Medical Diagnosis L shoulder adhesive capsulitis   Referring Provider Bertram Savin, MD      Strength   Right/Left Hip Left;Right   Right Hip Flexion 4+/5   Right Hip Extension 4/5   Right Hip External Rotation  4/5   Right Hip Internal Rotation 4/5   Right Hip ABduction 4+/5   Right Hip ADduction 4+/5   Left Hip Flexion 4/5   Left Hip Extension 4/5   Left Hip External Rotation 4-/5   Left Hip Internal Rotation 4-/5   Left Hip ABduction 4/5   Left Hip ADduction 4-/5   Right/Left Knee Left;Right   Right Knee Flexion 5/5   Right Knee Extension 4+/5    Left Knee Flexion 4/5   Left Knee Extension 4/5          Today's Treatment  Therex: UBE: lvl 2.5, 3 min each direction  Goal testing   MMT testing  Therex: Supine L shoulder AAROM Flexion with wand 10 x 5"; 3/10 L shoulder pain at end range Supine L shoulder AAROM ER with wand 10 x 5"  Seated L shoulder AAROM Abduction with wand 10 x 5" Hooklying bridge x 10 reps Hooklying alternating hip abd/ER with green TB x 10 reps; aching pain reported over L greater trochanteric area  Ionto Patch (#1 of 6) to L greater trochanteric area -  Dexamethasone 1 mL, 80 mA-min, 4-6 hr wear time           PT Short Term Goals - 10/07/16 1718      PT SHORT TERM GOAL #1   Title Independent with initial HEPs by 10/01/16   Status Achieved           PT Long Term Goals - 10/07/16 1720      PT LONG TERM GOAL #1   Title Independent with advanced HEP as  indicated by 11/19/16   Status On-going     PT LONG TERM GOAL #2   Title B hip strength >/= 4+/5 for improved function by 11/19/16   Status On-going  Pt. still not met with all      PT LONG TERM GOAL #3   Title Pt will walk w/o limp or increased L hip pain by 11/19/16   Status On-going  Pt. walking without limp or pain in clinic however reporting still feels she limps after lots of walking.      PT LONG TERM GOAL #4   Title L shoulder ROM within 10 degrees of R w/o pain by 11/19/16   Status On-going     PT LONG TERM GOAL #5   Title L shoulder strength >/= 4+/5 for improved functional UE use by 11/19/16   Status On-going     PT LONG TERM GOAL #6   Title Pt will report ability to dress upper body and perform daily household chores w/o limitation due to L shoulder ROM/pain by 11/19/16   Status Partially Met  Only complaint was undressing however daily household chores pain free and not limited.                 Plan - 10/07/16 1713    Clinical Impression Statement Pt. returning to therapy after 10 day absence.  Pt. had MD  f/u on 10/18 regarding shoulder and reported MD wanted pt. to continue therapy for shoulder.    Pt. still with L shoulder pain at 3/10 in end range flexion and reaching behind back with IR.  Pt. still reporting L hip pain while sitting however able to walk without hip pain at this point.  Strength testing revealed B hip / knee strength nearly unchanged since evaluation.  Pt. has been inconsistent with therapy to this point and admits to inconsistent adherence to HEP.  Iontophoresis patch #1/6 applied to pt. L hip today.  Will plan to monitor response to ionto next treatment on 10/11/16 at next appointment.     PT Treatment/Interventions Patient/family education;Therapeutic exercise;Neuromuscular re-education;Manual techniques;Dry needling;Taping;Electrical Stimulation;Moist Heat;Ultrasound;Iontophoresis '4mg'$ /ml Dexamethasone;Cryotherapy;Vasopneumatic Device;Therapeutic activities;Functional mobility training;Gait training;ADLs/Self Care Home Management   PT Next Visit Plan monitor response to ionto; Ionto patch #2/6 for hip prn; Core/hip strengthening; Postural training; L shoulder ROM & strengthening; Scapular stabilization - Plan for alternating days focus on L hip and L shoulder with adjustments made based on pain/functional limitation reports and progress demonstrated      Patient will benefit from skilled therapeutic intervention in order to improve the following deficits and impairments:  Pain, Impaired flexibility, Decreased range of motion, Decreased strength, Decreased activity tolerance, Difficulty walking  Visit Diagnosis: Pain in left hip  Difficulty in walking, not elsewhere classified  Acute pain of left shoulder  Stiffness of left shoulder, not elsewhere classified     Problem List Patient Active Problem List   Diagnosis Date Noted  . Ventricular pre-excitation 08/24/2012  . Nervous   . Anxiety    Bess Harvest, PTA 10/07/16 6:18 PM  [facility] [place] [facility] [address]  [place], [place], [address] Phone: [phone]   Fax:  [phone]  Name: Pamela Khan MRN: [id number] Date of Birth: 1956-02-15

## 2016-10-11 ENCOUNTER — Ambulatory Visit: Payer: BLUE CROSS/BLUE SHIELD

## 2016-10-11 DIAGNOSIS — M25512 Pain in left shoulder: Secondary | ICD-10-CM

## 2016-10-11 DIAGNOSIS — M25612 Stiffness of left shoulder, not elsewhere classified: Secondary | ICD-10-CM

## 2016-10-11 DIAGNOSIS — M25552 Pain in left hip: Secondary | ICD-10-CM

## 2016-10-11 DIAGNOSIS — R262 Difficulty in walking, not elsewhere classified: Secondary | ICD-10-CM

## 2016-10-11 NOTE — Therapy (Signed)
[facility] [place] [facility] [address]  [address] [place], [place], [address] Phone: [phone]   Fax:  [phone]  Physical Therapy Treatment  Patient Details  Name: Pamela Khan MRN: [id number] Date of Birth: 04/14/1956 Referring Provider: Brian J. Swinteck, MD  Encounter Date: 10/11/2016      PT End of Session - 10/11/16 1628    Visit Number 5   Number of Visits 16   Date for PT Re-Evaluation 11/19/16   Authorization Type BCBS   Authorization - Number of Visits 60   PT Start Time 1619   PT Stop Time 1700   PT Time Calculation (min) 41 min   Activity Tolerance Patient tolerated treatment well   Behavior During Therapy WFL for tasks assessed/performed      Past Medical History:  Diagnosis Date  . Anxiety   . Nervous   . Ventricular pre-excitation     Past Surgical History:  Procedure Laterality Date  . CESAREAN SECTION    . FACIAL COSMETIC SURGERY    . TONSILLECTOMY      There were no vitals filed for this visit.      Subjective Assessment - 10/11/16 1623    Subjective Pt. reporting L hip and lateral leg has been giving her more pain recently than the L shoulder.     Patient Stated Goals "Walk without pain and stop my leg from shaking"   Currently in Pain? Yes   Pain Score 4    Pain Location Hip   Pain Orientation Anterior   Pain Descriptors / Indicators Dull   Pain Radiating Towards down lateral thigh to knee    Pain Onset More than a month ago   Pain Frequency Intermittent   Multiple Pain Sites No            [facility] PT Assessment - 10/11/16 1626      Assessment   Medical Diagnosis L hip trochanteric bursitis   Referring Provider Brian J. Swinteck, MD   Next MD Visit 10/19/16     Today's treatment:   Therex: NuStep: lvl 4, 6 min  L ITB, HS, glute stretch x 30 sec each  Hooklying bridge with L hip abd/ER with blue TB x 15 reps  R sidelying clam shell with blue TB x 10 reps  Hooklying  alternating high knee march blue x 15 reps Standing holding counter:         Side stepping with yellow TB x 1 lap down/back         Counter squat x 10 reps         Alternating SL hip flexion with yellow TB x 10 reps on each         Alternating hip extension with yellow TB x 10 reps on each         Alternating hip abduction with yellow TB x 10 reps on each        PT Education - 10/11/16 1710    Education provided Yes   Education Details standing hip flexion, extension, abduction with yellow TB around ankles and issued to pt.    Person(s) Educated Patient   Methods Explanation;Demonstration;Handout   Comprehension Verbalized understanding;Returned demonstration;Need further instruction;Verbal cues required          PT Short Term Goals - 10/07/16 1718      PT SHORT TERM GOAL #1   Title Independent with initial HEPs by 10/01/16   Status Achieved             PT Long Term Goals - 10/07/16 1720      PT LONG TERM GOAL #1   Title Independent with advanced HEP as indicated by 11/19/16   Status On-going     PT LONG TERM GOAL #2   Title B hip strength >/= 4+/5 for improved function by 11/19/16   Status On-going  Pt. still not met with all      PT LONG TERM GOAL #3   Title Pt will walk w/o limp or increased L hip pain by 11/19/16   Status On-going  Pt. walking without limp or pain in clinic however reporting still feels she limps after lots of walking.      PT LONG TERM GOAL #4   Title L shoulder ROM within 10 degrees of R w/o pain by 11/19/16   Status On-going     PT LONG TERM GOAL #5   Title L shoulder strength >/= 4+/5 for improved functional UE use by 11/19/16   Status On-going     PT LONG TERM GOAL #6   Title Pt will report ability to dress upper body and perform daily household chores w/o limitation due to L shoulder ROM/pain by 11/19/16   Status Partially Met  Only complaint was undressing however daily household chores pain free and not limited.                  Plan - 10/11/16 1629    Clinical Impression Statement Pt. reporting L lateral thigh has been giving her more pain than the L shoulder recently thus L hip/lateral thigh focused on today.  Pt. reporting more consistent adherence to HEP over weekend since last treatment.  Pt. reporting she couldn't feel benefit from ionto patch applied last treatment thus ionto not reapplied today.  Pt. still very tight and guarded in L LE demonstrating difficulty relaxing with LE stretching.  Pt. tolerated initiation of standing 3-way SLR with yellow TB today well thus these activities issued to pt. via handout.    Will plan to review additions to HEP and monitor response next treatment.   PT Treatment/Interventions Patient/family education;Therapeutic exercise;Neuromuscular re-education;Manual techniques;Dry needling;Taping;Electrical Stimulation;Moist Heat;Ultrasound;Iontophoresis 81m/ml Dexamethasone;Cryotherapy;Vasopneumatic Device;Therapeutic activities;Functional mobility training;Gait training;ADLs/Self Care Home Management   PT Next Visit Plan Ionto patch #2/6 for hip prn; Core/hip strengthening; Postural training; L shoulder ROM & strengthening; Scapular stabilization - Plan for alternating days focus on L hip and L shoulder with adjustments made based on pain/functional limitation reports and progress demonstrated      Patient will benefit from skilled therapeutic intervention in order to improve the following deficits and impairments:  Pain, Impaired flexibility, Decreased range of motion, Decreased strength, Decreased activity tolerance, Difficulty walking  Visit Diagnosis: Pain in left hip  Difficulty in walking, not elsewhere classified  Acute pain of left shoulder  Stiffness of left shoulder, not elsewhere classified     Problem List Patient Active Problem List   Diagnosis Date Noted  . Ventricular pre-excitation 08/24/2012  . Nervous   . Anxiety     MBess Harvest PTA 10/11/2016, 6:01  PM  [facility] [place] [address]Phone: [phone]  Fax:  [phone] Name: Pamela RAETHERMRN: [id number] of Birth: [birth year]

## 2016-10-14 ENCOUNTER — Ambulatory Visit: Payer: BLUE CROSS/BLUE SHIELD | Attending: Orthopedic Surgery | Admitting: Physical Therapy

## 2016-10-14 DIAGNOSIS — M25612 Stiffness of left shoulder, not elsewhere classified: Secondary | ICD-10-CM | POA: Insufficient documentation

## 2016-10-14 DIAGNOSIS — M25512 Pain in left shoulder: Secondary | ICD-10-CM | POA: Insufficient documentation

## 2016-10-14 DIAGNOSIS — R262 Difficulty in walking, not elsewhere classified: Secondary | ICD-10-CM | POA: Diagnosis present

## 2016-10-14 DIAGNOSIS — M25552 Pain in left hip: Secondary | ICD-10-CM | POA: Diagnosis present

## 2016-10-14 NOTE — Therapy (Signed)
[facility] [address]  [place], [place], [address] Phone: [phone]   Fax:  [phone]  Physical Therapy Treatment  Patient Details  Name: Pamela Khan MRN: [id number] Date of Birth: March 11, 1956 Referring Provider: Bertram Savin, MD (hip) & Metta Clines. Supple, MD (shoulder)  Encounter Date: 10/14/2016      PT End of Session - 10/14/16 1702    Visit Number 6   Number of Visits 16   Date for PT Re-Evaluation 11/19/16   Authorization Type BCBS   Authorization - Number of Visits 60   PT Start Time 1702   PT Stop Time 1745   PT Time Calculation (min) 43 min   Activity Tolerance Patient tolerated treatment well   Behavior During Therapy WFL for tasks assessed/performed      Past Medical History:  Diagnosis Date  . Anxiety   . Nervous   . Ventricular pre-excitation     Past Surgical History:  Procedure Laterality Date  . CESAREAN SECTION    . FACIAL COSMETIC SURGERY    . TONSILLECTOMY      There were no vitals filed for this visit.      Subjective Assessment - 10/14/16 1703    Subjective Pt noting more L shoulder/arm today with hip pain lessening. Reports completing HEPs daily, often while at stand-up desk at work and using and umbrella for the wand exercises.   Patient Stated Goals "Walk without pain and stop my leg from shaking"   Currently in Pain? Yes   Pain Score 2    Pain Location Hip   Pain Onset --   Pain Score 0  up to 4/09 with certain movements   Pain Location Shoulder   Pain Orientation Left            [facility] PT Assessment - 10/14/16 1702      Assessment   Medical Diagnosis L hip trochanteric bursitis & R shoulder adhesive capsulitis   Referring Provider Bertram Savin, MD (hip) & Metta Clines. Supple, MD (shoulder)   Next MD Visit 10/19/16 - Dr Lyla Glassing; PRN with Dr. Onnie Graham           Today's Treatment  TherEx UBE - lvl 2.5 fwd/back x 2' each Doorway pec stretch  (low/mid/high) 2x30" each Standing against 6" FR on wall:    Scapular squeeze around roll 10x5"    B Scapular retraction + Shoulder Horiz ABD with red TB 10x3"    B Scapular retraction + Shoulder ER with red TB 10x3"    B Scapular retraction + Alt Opp Shoulder Flex & Ext ABD with red TB 10x3" BATCA Low Row 20# x10 BATCA Pull Down 15# x10 (AAROM into L shoulder flexion on release)  Pulleys    L shoulder flexion x 2'    L shoulder scaption x 2' B Shoulder Row with green TB 10x3" Scapular retraction + Shoulder extension to neutral with green TB 10x3"          PT Education - 10/14/16 1745    Education provided Yes   Education Details Updates to shoulder HEP   Person(s) Educated Patient   Methods Explanation;Demonstration;Handout   Comprehension Verbalized understanding;Returned demonstration          PT Short Term Goals - 10/07/16 1718      PT SHORT TERM GOAL #1   Title Independent with initial HEPs by 10/01/16   Status Achieved  PT Long Term Goals - 10/07/16 1720      PT LONG TERM GOAL #1   Title Independent with advanced HEP as indicated by 11/19/16   Status On-going     PT LONG TERM GOAL #2   Title B hip strength >/= 4+/5 for improved function by 11/19/16   Status On-going  Pt. still not met with all      PT LONG TERM GOAL #3   Title Pt will walk w/o limp or increased L hip pain by 11/19/16   Status On-going  Pt. walking without limp or pain in clinic however reporting still feels she limps after lots of walking.      PT LONG TERM GOAL #4   Title L shoulder ROM within 10 degrees of R w/o pain by 11/19/16   Status On-going     PT LONG TERM GOAL #5   Title L shoulder strength >/= 4+/5 for improved functional UE use by 11/19/16   Status On-going     PT LONG TERM GOAL #6   Title Pt will report ability to dress upper body and perform daily household chores w/o limitation due to L shoulder ROM/pain by 11/19/16   Status Partially Met  Only complaint was  undressing however daily household chores pain free and not limited.                 Plan - 10/14/16 1710    Clinical Impression Statement Focus of today's visit on L shoulder adhesive capsulitis due to worsening of pain recently. Pt continues to demonstrate poor posture, therefore targeted anterior stretching and scapular stabilization/strengthening to promote neutral shoulder alignment for improved glenohumeral mechanics, with HEP updated accordingly. Focus of next visit on 10/18/16 will be on hip bursitis as pt to see MD for f/u on 10/19/16.   PT Treatment/Interventions Patient/family education;Therapeutic exercise;Neuromuscular re-education;Manual techniques;Dry needling;Taping;Electrical Stimulation;Moist Heat;Ultrasound;Iontophoresis '4mg'$ /ml Dexamethasone;Cryotherapy;Vasopneumatic Device;Therapeutic activities;Functional mobility training;Gait training;ADLs/Self Care Home Management   PT Next Visit Plan MD note for appt with Dr. Lyla Glassing (hip) on 10/19/16; Provide HEP instructions for 3 way doorway stretch; Ionto patch #2/6 for hip PRN; Core/hip strengthening; Postural training; L shoulder ROM & strengthening; Scapular stabilization - Plan for alternating days focus on L hip and L shoulder with adjustments made based on pain/functional limitation reports and progress demonstrated   Consulted and Agree with Plan of Care Patient      Patient will benefit from skilled therapeutic intervention in order to improve the following deficits and impairments:  Pain, Impaired flexibility, Decreased range of motion, Decreased strength, Decreased activity tolerance, Difficulty walking  Visit Diagnosis: Pain in left hip  Difficulty in walking, not elsewhere classified  Acute pain of left shoulder  Stiffness of left shoulder, not elsewhere classified     Problem List Patient Active Problem List   Diagnosis Date Noted  . Ventricular pre-excitation 08/24/2012  . Nervous   . Anxiety      Percival Spanish, PT, MPT 10/14/2016, 6:05 PM  [facility] [address]  [place], [place], [address] Phone: [phone]   Fax:  [phone]  Name: Pamela Khan MRN: [id number] Date of Birth: January 07, 1956

## 2016-10-18 ENCOUNTER — Ambulatory Visit: Payer: BLUE CROSS/BLUE SHIELD

## 2016-10-18 DIAGNOSIS — M25512 Pain in left shoulder: Secondary | ICD-10-CM

## 2016-10-18 DIAGNOSIS — M25552 Pain in left hip: Secondary | ICD-10-CM

## 2016-10-18 DIAGNOSIS — R262 Difficulty in walking, not elsewhere classified: Secondary | ICD-10-CM

## 2016-10-18 DIAGNOSIS — M25612 Stiffness of left shoulder, not elsewhere classified: Secondary | ICD-10-CM

## 2016-10-18 NOTE — Therapy (Addendum)
[facility] [address]  [place], [place], [address] Phone: [phone]   Fax:  [phone]  Physical Therapy Treatment  Patient Details  Name: Pamela Khan MRN: [id number] Date of Birth: [birth year] Referring Provider: Bertram Savin, MD (hip) & Metta Clines. Supple, MD (shoulder)  Encounter Date: 10/18/2016      PT End of Session - 10/18/16 1711    Visit Number 7   Number of Visits 16   Date for PT Re-Evaluation 11/19/16   Authorization Type [employer]   Authorization - Number of Visits 60   PT Start Time 1704   PT Stop Time 1750   PT Time Calculation (min) 46 min   Activity Tolerance Patient tolerated treatment well   Behavior During Therapy WFL for tasks assessed/performed      Past Medical History:  Diagnosis Date  . Anxiety   . Nervous   . Ventricular pre-excitation     Past Surgical History:  Procedure Laterality Date  . CESAREAN SECTION    . FACIAL COSMETIC SURGERY    . TONSILLECTOMY      There were no vitals filed for this visit.      Subjective Assessment - 10/18/16 1710    Subjective Pt. reporting pain free in L shoulder and L hip however L shoulder still hurts with movement and L lateral thigh feels, "tired", while standing and walking.     Patient Stated Goals "Walk without pain and stop my leg from shaking"   Currently in Pain? No/denies   Pain Score 0-No pain   Multiple Pain Sites No            [facility] PT Assessment - 10/18/16 1716      Strength   Right/Left Hip Left;Right   Right Hip Flexion 4+/5   Right Hip Extension 4/5   Right Hip External Rotation  4/5   Right Hip Internal Rotation 4-/5   Right Hip ABduction 4+/5   Right Hip ADduction 4+/5   Left Hip Flexion 4/5   Left Hip Extension 4-/5   Left Hip External Rotation 4-/5   Left Hip Internal Rotation 4-/5   Left Hip ABduction 4/5   Left Hip ADduction 4-/5   Right/Left Knee Left;Right   Right Knee Flexion 5/5   Right  Knee Extension 4+/5   Left Knee Flexion 4/5   Left Knee Extension 4+/5       Today's treatment:  Therex: NuStep: lvl 5, 6 min   MMT testing   Goal testing   Therex: B single leg bridge x 10 reps each side  Hooklying L hip flexion x 10 reps   Hooklying bridge with blue TB x 15 reps R sidelying clam shell with blue TB around x 10 reps  Hooklying bridge with adduction ball squeeze 5" x 10 reps  Side stepping with red TB around ankles x 20 ft  Monster walk with red TB around ankles x 20 ft          PT Short Term Goals - 10/07/16 1718      PT SHORT TERM GOAL #1   Title Independent with initial HEPs by 10/01/16   Status Achieved           PT Long Term Goals - 10/18/16 1713      PT LONG TERM GOAL #1   Title Independent with advanced HEP as indicated by 11/19/16   Status On-going     PT LONG TERM GOAL #  2   Title B hip strength >/= 4+/5 for improved function by 11/19/16   Status Partially Met  Pt. has only met for R hip flexion, abduction, and adduction.      PT LONG TERM GOAL #3   Title Pt will walk w/o limp or increased L hip pain by 11/19/16   Status Partially Met  Pt. walking without limp or pain in clinic however reporting still feeling L lateral leg weakness, and, "tingling/numbness".       PT LONG TERM GOAL #4   Title L shoulder ROM within 10 degrees of R w/o pain by 11/19/16   Status On-going     PT LONG TERM GOAL #5   Title L shoulder strength >/= 4+/5 for improved functional UE use by 11/19/16   Status On-going     PT LONG TERM GOAL #6   Title Pt will report ability to dress upper body and perform daily household chores w/o limitation due to L shoulder ROM/pain by 11/19/16   Status Partially Met  Only complaint was undressing however daily household chores pain free and not limited.                 Plan - 10/18/16 1755    Clinical Impression Statement Focus of today's visit was L hip strengthening following strength testing that revealed  continued L-sided hip weakness.  Pt. only able to meet strength goal on R hip abduction, adduction, and flexion strength of 4+/5.  Pt. still reporting L anterior/lateral thigh tingling/burning throughout standing therex today which subsides with sitting rest breaks.  Pt. able to walk without limp in treatment today however reporting, "weakness in L thigh muscle".  Pt. progressing at this point however treatment progress limited by having to address L shoulder and L hip concerns.  Pt. with MD f/u on 11/7.     PT Treatment/Interventions Patient/family education;Therapeutic exercise;Neuromuscular re-education;Manual techniques;Dry needling;Taping;Electrical Stimulation;Moist Heat;Ultrasound;Iontophoresis '4mg'$ /ml Dexamethasone;Cryotherapy;Vasopneumatic Device;Therapeutic activities;Functional mobility training;Gait training;ADLs/Self Care Home Management   PT Next Visit Plan Provide HEP instructions for 3 way doorway stretch; Ionto patch #2/6 for hip PRN; Core/hip strengthening; Postural training; L shoulder ROM & strengthening; Scapular stabilization - Plan for alternating days focus on L hip and L shoulder with adjustments made based on pain/functional limitation reports and progress demonstrated      Patient will benefit from skilled therapeutic intervention in order to improve the following deficits and impairments:  Pain, Impaired flexibility, Decreased range of motion, Decreased strength, Decreased activity tolerance, Difficulty walking  Visit Diagnosis: Pain in left hip  Difficulty in walking, not elsewhere classified  Acute pain of left shoulder  Stiffness of left shoulder, not elsewhere classified     Problem List Patient Active Problem List   Diagnosis Date Noted  . Ventricular pre-excitation 08/24/2012  . Nervous   . Anxiety     Bess Harvest, PTA 10/18/16 6:04 PM  [facility] [address]  [place], [place],  [address] Phone: [phone]   Fax:  [phone]  Name: Pamela Khan MRN: [id number] Date of Birth: [birth year]  PHYSICAL THERAPY DISCHARGE SUMMARY  Visits from Start of Care: 7  Current functional level related to goals / functional outcomes:   Unable to assess status at discharge as pt has failed to return to PT since 10/18/16. Will proceed with discharge for PT due to failure to return in >30 days.   Remaining deficits:   Unable to assess   Education / Equipment:  HEP  Plan: Patient agrees to discharge.  Patient goals were partially met. Patient is being discharged due to not returning since the last visit.  ?????     Percival Spanish, PT, MPT 11/22/16, 2:40 PM  [facility] [address]  [place], [place], [address] Phone: [phone]   Fax:  [phone]

## 2016-10-21 ENCOUNTER — Ambulatory Visit: Payer: BLUE CROSS/BLUE SHIELD | Admitting: Physical Therapy

## 2017-01-04 ENCOUNTER — Encounter: Payer: Self-pay | Admitting: Gastroenterology

## 2017-09-27 ENCOUNTER — Ambulatory Visit: Payer: BLUE CROSS/BLUE SHIELD | Admitting: Neurology

## 2017-10-19 ENCOUNTER — Encounter (INDEPENDENT_AMBULATORY_CARE_PROVIDER_SITE_OTHER): Payer: Self-pay

## 2017-10-19 ENCOUNTER — Encounter: Payer: Self-pay | Admitting: Neurology

## 2017-10-19 ENCOUNTER — Ambulatory Visit: Payer: BLUE CROSS/BLUE SHIELD | Admitting: Neurology

## 2017-10-19 VITALS — BP 129/73 | HR 70 | Ht 65.0 in | Wt 140.0 lb

## 2017-10-19 DIAGNOSIS — M21372 Foot drop, left foot: Secondary | ICD-10-CM

## 2017-10-19 DIAGNOSIS — M79605 Pain in left leg: Secondary | ICD-10-CM | POA: Diagnosis not present

## 2017-10-19 DIAGNOSIS — R251 Tremor, unspecified: Secondary | ICD-10-CM | POA: Diagnosis not present

## 2017-10-19 HISTORY — DX: Foot drop, left foot: M21.372

## 2017-10-19 HISTORY — DX: Tremor, unspecified: R25.1

## 2017-10-19 NOTE — Progress Notes (Signed)
Reason for visit: Left leg pain  Referring physician: Dr. Suzzanne CloudElsner  Pamela Khan is a 61 y.o. female  History of present illness:  Pamela Khan is a 61 year old right-handed white female with a history of left thigh discomfort that began in February 2016.  The patient had spontaneous onset of the left leg pain that is in the left hip, posterior and anterior portion of the left thigh.  The patient denies any pain down below the knee but she does have pain behind the knee at times.  She denies any numbness of the left extremity.  She denies any significant weakness of the left leg, she denies any symptoms involving the right leg.  She does have some occasional neck discomfort which she denies pain down the arms, she has a tennis elbow on the left elbow.  The patient denies any balance changes or difficulty with controlling the bowels of the bladder.  She has undergone MRI evaluation of the lumbar spine did was reportedly unremarkable, she also had MRI of the brain that showed minimal white matter changes.  The patient indicates that shortly after the onset of the left leg pain she began having tremors that have involved the left foot.  The tremors are a resting type tremor, she also has tremors involving the left hand.  The patient has been sent to this office for evaluation of the tremor and of the left leg pain.  She indicates that the leg pain comes on with sitting, she feels completely normal when she is up on her feet, she is not limited in her ability to perform physical activity, bend, stoop, or walk.  Past Medical History:  Diagnosis Date  . Anxiety   . Nervous   . Ventricular pre-excitation     Past Surgical History:  Procedure Laterality Date  . CESAREAN SECTION     x2  . FACIAL COSMETIC SURGERY    . TONSILLECTOMY      Family History  Problem Relation Age of Onset  . Mental illness Unknown   . Heart disease Unknown   . Hyperlipidemia Unknown     Social history:  reports  that  has never smoked. she has never used smokeless tobacco. She reports that she drinks alcohol. She reports that she does not use drugs.  Medications:  Prior to Admission medications   Medication Sig Start Date End Date Taking? Authorizing Provider  acetaminophen (TYLENOL) 325 MG tablet Take 650 mg by mouth every 6 (six) hours as needed. For headache   Yes [provider]  aspirin 81 MG tablet Take 81 mg by mouth daily.   Yes [provider]  ibuprofen (ADVIL,MOTRIN) 600 MG tablet Take 1 tablet (600 mg total) by mouth every 6 (six) hours as needed. 01/17/15  Yes Phelps, Erin O, PA-C  LORazepam (ATIVAN) 1 MG tablet Take 1 mg by mouth daily as needed. For anxiety   Yes [provider]     No Known Allergies  ROS:  Out of a complete 14 system review of symptoms, the patient complains only of the following symptoms, and all other reviewed systems are negative.  Tremor Leg pain  Blood pressure 129/73, pulse 70, height 5\' 5"  (1.651 m), weight 140 lb (63.5 kg).  Physical Exam  General: The patient is alert and cooperative at the time of the examination.  Eyes: Pupils are equal, round, and reactive to light. Discs are flat bilaterally.  Neck: The neck is supple, no carotid bruits are  noted.  Respiratory: The respiratory examination is clear.  Cardiovascular: The cardiovascular examination reveals a regular rate and rhythm, no obvious murmurs or rubs are noted.  Skin: Extremities are without significant edema.  Neurologic Exam  Mental status: The patient is alert and oriented x 3 at the time of the examination. The patient has apparent normal recent and remote memory, with an apparently normal attention span and concentration ability.  Cranial nerves: Facial symmetry is present. There is good sensation of the face to pinprick and soft touch bilaterally. The strength of the facial muscles and the muscles to head turning and shoulder shrug are normal  bilaterally. Speech is well enunciated, no aphasia or dysarthria is noted. Extraocular movements are full. Visual fields are full. The tongue is midline, and the patient has symmetric elevation of the soft palate. No obvious hearing deficits are noted.  Motor: The motor testing reveals 5 over 5 strength of all 4 extremities, with exception of a mild foot drop on the left. Good symmetric motor tone is noted throughout.  Sensory: Sensory testing is intact to pinprick, soft touch, vibration sensation, and position sense on all 4 extremities. No evidence of extinction is noted.  Coordination: Cerebellar testing reveals good finger-nose-finger and heel-to-shin bilaterally.  A resting tremor seen on the left hand and left foot.  Gait and station: Gait is normal. Tandem gait is normal. Romberg is negative. No drift is seen.  Reflexes: Deep tendon reflexes are symmetric and normal bilaterally. Toes are downgoing bilaterally.   Assessment/Plan:  1.  Left leg discomfort, low-grade sciatic neuropathy  2.  Left foot drop  3.  Resting tremor, left arm and leg, consider parkinsonism  The etiology of the left leg pain likely is related to low-grade compression of the sciatic nerve at the sciatic notch.  The patient does appear to have a left foot drop, she will undergo nerve conduction studies of both legs and EMG on the left leg.  At the patient has resting tremors involving the left leg and left arm.  These are suggestive of parkinsonism, but the patient claims that she has had these tremors for almost 3 years and she has not developed any other signs of Parkinson's disease.  This will need to be followed over time on a conservative basis.  She denies any family history of tremor.  She will follow-up for the EMG evaluation.  Marlan Palau. Keith Kalaysia Demonbreun MD 10/19/2017 8:51 AM  [facility] [address] [place], [place] [id number]  Phone [phone] Fax [phone]

## 2017-10-19 NOTE — Patient Instructions (Signed)
   We will get EMG and NCV study to look at nerve function on the left leg.

## 2017-11-01 ENCOUNTER — Encounter: Payer: BLUE CROSS/BLUE SHIELD | Admitting: Neurology

## 2017-11-28 ENCOUNTER — Encounter (INDEPENDENT_AMBULATORY_CARE_PROVIDER_SITE_OTHER): Payer: Self-pay

## 2017-11-28 ENCOUNTER — Encounter: Payer: Self-pay | Admitting: Neurology

## 2017-11-28 ENCOUNTER — Ambulatory Visit (INDEPENDENT_AMBULATORY_CARE_PROVIDER_SITE_OTHER): Payer: BLUE CROSS/BLUE SHIELD | Admitting: Neurology

## 2017-11-28 ENCOUNTER — Ambulatory Visit: Payer: BLUE CROSS/BLUE SHIELD | Admitting: Neurology

## 2017-11-28 DIAGNOSIS — M79605 Pain in left leg: Secondary | ICD-10-CM | POA: Diagnosis not present

## 2017-11-28 DIAGNOSIS — R102 Pelvic and perineal pain: Secondary | ICD-10-CM

## 2017-11-28 NOTE — Progress Notes (Addendum)
The patient comes in today for EMG nerve conduction study evaluation.  Nerve conduction studies of both legs were normal.  EMG of the left leg shows isolated denervation of the left gastrocnemius muscle.  EMG suggests that a good portion of that muscle is scar tissue.  There is no evidence of weakness that would produce a left foot drop, rather the patient likely has some fibrosis of the gastrocnemius muscle pulling the left foot down.  Patient also has a resting tremor of the left arm and leg.  This will need to be followed.  The patient will be set up for MRI evaluation of the pelvis looking at the origin of the sciatic nerve as it enters the leg.    MNC    Nerve / Sites Muscle Latency Ref. Amplitude Ref. Rel Amp Segments Distance Velocity Ref. Area    ms ms mV mV %  cm m/s m/s mVms  L Peroneal - EDB     Ankle EDB 5.0 ?6.5 10.4 ?2.0 100 Ankle - EDB 9   21.1     Fib head EDB 9.7  11.3  108 Fib head - Ankle 22 47 ?44 35.7     Pop fossa EDB 11.6  10.1  89 Pop fossa - Fib head 10 52 ?44 29.5         Pop fossa - Ankle      R Peroneal - EDB     Ankle EDB 4.1 ?6.5 6.1 ?2.0 100 Ankle - EDB 9   16.6     Fib head EDB 10.5  6.1  101 Fib head - Ankle 35 55 ?44 18.0     Pop fossa EDB 12.0  5.9  96.7 Pop fossa - Fib head 10 66 ?44 17.0         Pop fossa - Ankle      L Tibial - AH     Ankle AH 3.7 ?5.8 15.6 ?4.0 100 Ankle - AH 9   26.2     Pop fossa AH 12.0  10.9  69.5 Pop fossa - Ankle 44 53 ?41 25.8  R Tibial - AH     Ankle AH 4.1 ?5.8 15.6 ?4.0 100 Ankle - AH 9   35.0     Pop fossa AH 12.6  12.4  79.5 Pop fossa - Ankle 44 52 ?41 28.8             SNC    Nerve / Sites Rec. Site Peak Lat Ref.  Amp Ref. Segments Distance    ms ms V V  cm  R Superficial peroneal - Ankle     Lat leg Ankle 2.4 ?4.4 13 ?6 Lat leg - Ankle 14  L Superficial peroneal - Ankle     Lat leg Ankle 2.4 ?4.4 15 ?6 Lat leg - Ankle 14         H Reflex    Nerve H Lat Lat Hmax   ms ms   Left Right Ref. Left Right Ref.   Tibial - Soleus 28.6 29.6 ?35.0 24.4 31.9 ?35.0         EMG

## 2017-11-28 NOTE — Progress Notes (Signed)
Please refer to EMG and nerve conduction study procedure note. 

## 2017-11-28 NOTE — Procedures (Signed)
     HISTORY:  Pamela Khan is a 61 year old patient with a history of discomfort down the left leg that is worse with sitting, much better with standing and physical activity.  The patient is being evaluated for possible left sciatic neuropathy.  MRI of the lumbar spine has been relatively unremarkable.  NERVE CONDUCTION STUDIES:  Nerve conduction studies were performed on both lower extremities . The distal motor latencies and motor amplitudes for the peroneal and posterior tibial nerves were within normal limits. The nerve conduction velocities for these nerves were also normal. The H reflex latencies were normal. The sensory latencies for the peroneal nerves were within normal limits.   EMG STUDIES:  EMG study was performed on the left lower extremity:  The tibialis anterior muscle reveals 2 to 4K motor units with full recruitment. No fibrillations or positive waves were seen. The peroneus tertius muscle reveals 2 to 4K motor units with full recruitment. No fibrillations or positive waves were seen. The medial gastrocnemius muscle reveals 1 to 3K motor units with significantly reduced recruitment. 2+ positive waves were seen. The vastus lateralis muscle reveals 2 to 4K motor units with full recruitment. No fibrillations or positive waves were seen. The iliopsoas muscle reveals 2 to 4K motor units with full recruitment. No fibrillations or positive waves were seen. The biceps femoris muscle (long head) reveals 2 to 4K motor units with full recruitment. No fibrillations or positive waves were seen. The lumbosacral paraspinal muscles were tested at 3 levels, and revealed no abnormalities of insertional activity at all 3 levels tested. There was good relaxation.   IMPRESSION:  Nerve conduction studies done on both lower extremities were within normal limits.  No evidence of a peripheral neuropathy was seen.  EMG evaluation of the left lower extremity shows isolated denervation involving  the gastrocnemius muscle, no evidence of an overlying lumbosacral radiculopathy is seen.  Pamela Khan. Keith Jalaya Sarver MD 11/28/2017 4:29 PM  [facility] [address] [place], [place] [id number]  Phone [phone] Fax [phone]

## 2017-12-09 ENCOUNTER — Ambulatory Visit
Admission: RE | Admit: 2017-12-09 | Discharge: 2017-12-09 | Disposition: A | Discharge status: Home / Self Care | Payer: BLUE CROSS/BLUE SHIELD | Source: Ambulatory Visit | Attending: Neurology | Admitting: Neurology

## 2017-12-09 DIAGNOSIS — R102 Pelvic and perineal pain: Secondary | ICD-10-CM

## 2017-12-09 DIAGNOSIS — M79605 Pain in left leg: Secondary | ICD-10-CM

## 2017-12-09 MED ORDER — GADOBENATE DIMEGLUMINE 529 MG/ML IV SOLN
13.0000 mL | Freq: Once | INTRAVENOUS | Status: AC | PRN
  Administered 2017-12-09: 13 mL via INTRAVENOUS

## 2017-12-11 ENCOUNTER — Telehealth: Payer: Self-pay | Admitting: Neurology

## 2017-12-11 NOTE — Telephone Encounter (Signed)
I called the patient.  The MRI of the pelvis was unremarkable, no evidence of sciatic nerve compression.   MRI pelvis 12/09/17:  IMPRESSION: 1. No acute abnormality in the pelvis. Unremarkable appearance of the bilateral sciatic nerves.

## 2018-10-07 IMAGING — MR MR PELVIS WO/W CM
7 of 9 series · 36 of 48 positions shown · IV contrast (multihance)
Comparison: None.

CLINICAL DATA: Left thigh pain. Left foot tremor and footdrop.
Evaluate sciatic nerve.

EXAM:
MRI PELVIS WITHOUT AND WITH CONTRAST
TECHNIQUE: Multiplanar multisequence MR imaging of the pelvis was performed
both before and after administration of intravenous contrast.
CONTRAST:  13mL MULTIHANCE GADOBENATE DIMEGLUMINE 529 MG/ML IV SOLN
Creatinine was obtained on site at [HOSPITAL] at [HOSPITAL].
Results: Creatinine 0.7 mg/dL.

[Series 3: T1 · coronal · 5.0mm · 1.41mm/px · 6 of 32 slices shown (1 of 2)]
[im 1/32]
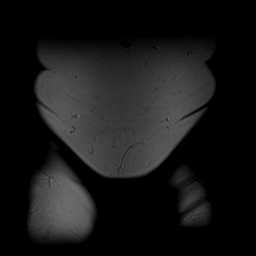
[im 7/32]
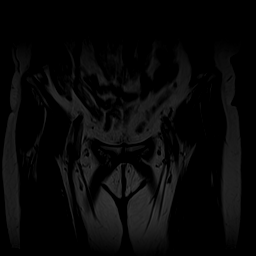
[im 13/32]
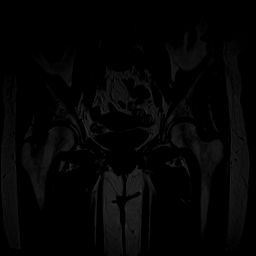
[im 19/32]
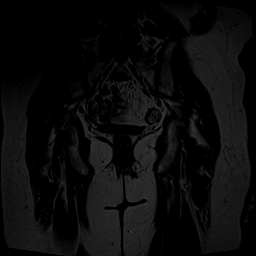
[im 25/32]
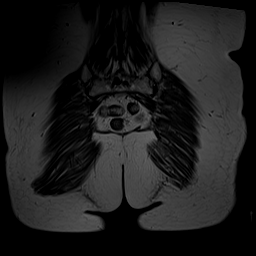
[im 32/32]
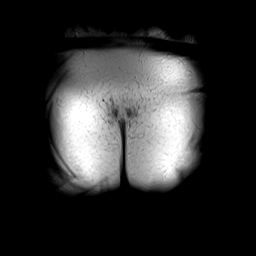

[Series 5: T1 · axial · 6.0mm · 0.74mm/px · z∈[-115,+153]mm · 5 of 33 slices shown (2 of 2)]
[im 1/33]
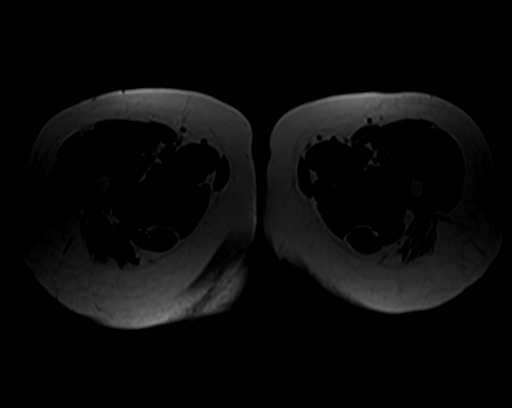
[im 9/33]
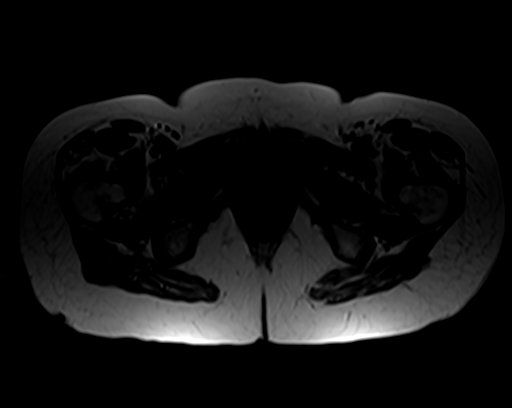
[im 17/33]
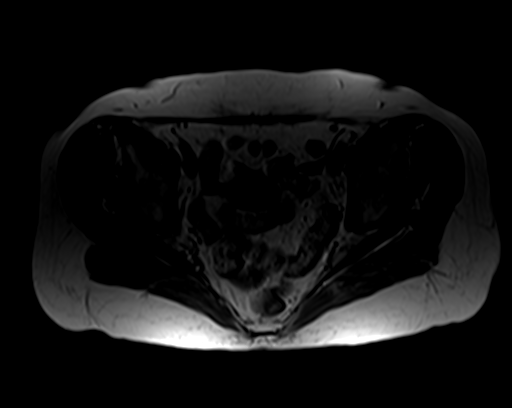
[im 25/33]
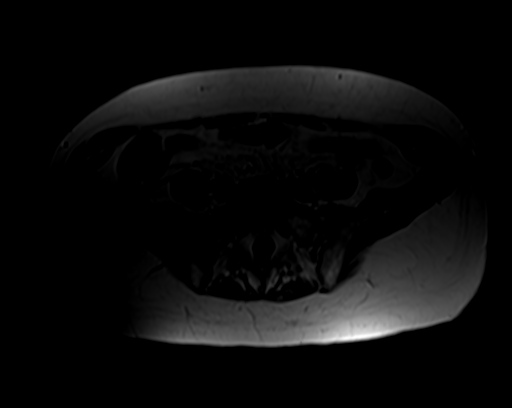
[im 33/33]
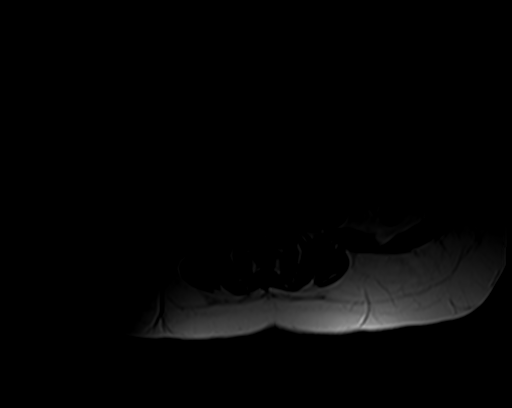

[Series 6: STIR · coronal · 5.0mm · 1.41mm/px · 5 of 35 slices shown]
[im 1/35]
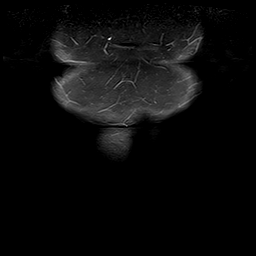
[im 9/35]
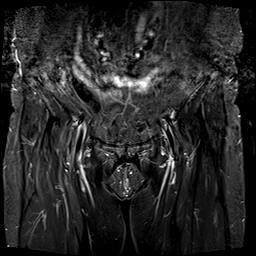
[im 18/35]
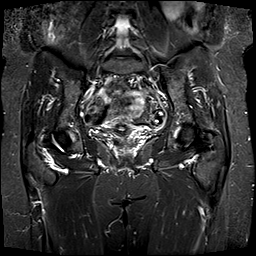
[im 26/35]
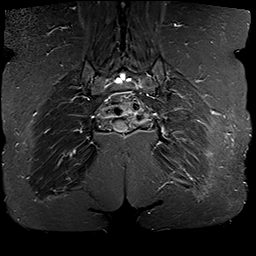
[im 35/35]
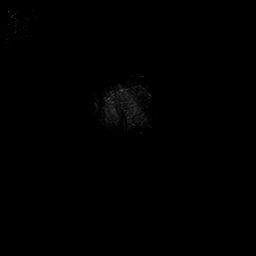

[Series 7: T2 fat-sat · axial · 6.0mm · 0.74mm/px · z∈[-115,+153]mm · 5 of 33 slices shown (1 of 2)]
[im 1/33]
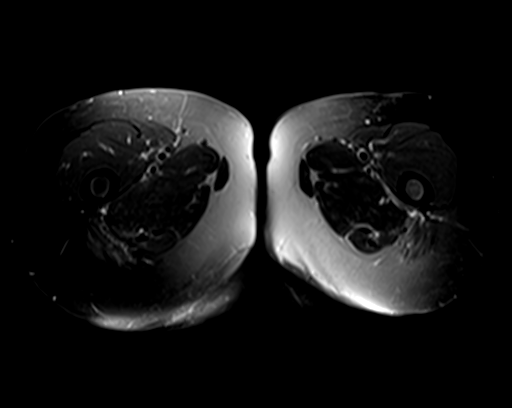
[im 9/33]
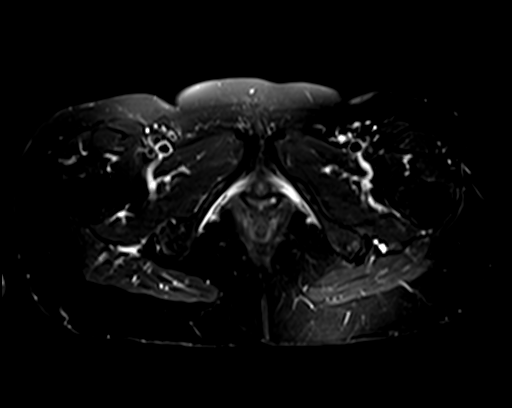
[im 17/33]
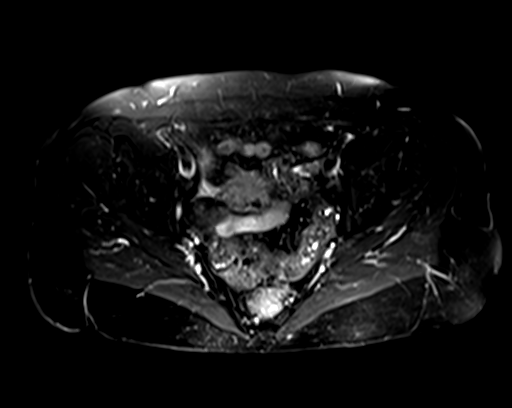
[im 25/33]
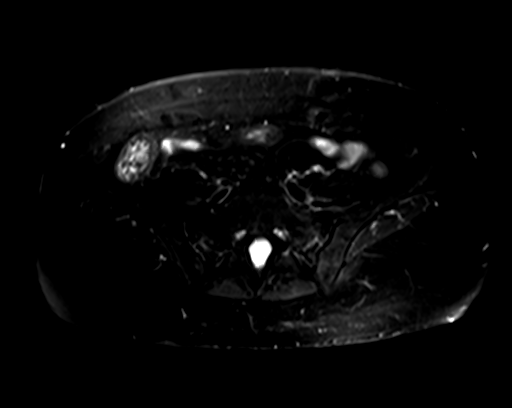
[im 33/33]
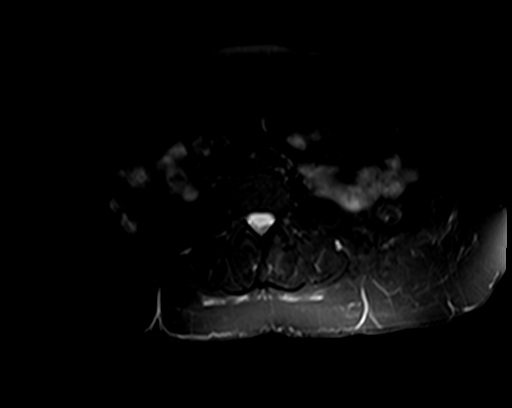

[Series 8: T2 fat-sat · sagittal · 6.0mm · 1.17mm/px · 6 of 42 slices shown (2 of 2)]
[im 1/42]
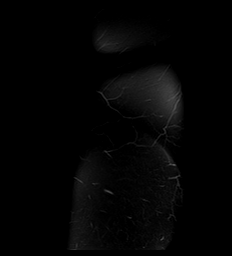
[im 9/42]
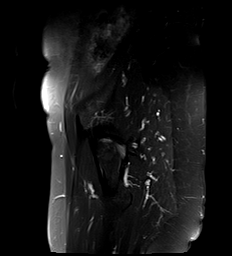
[im 17/42]
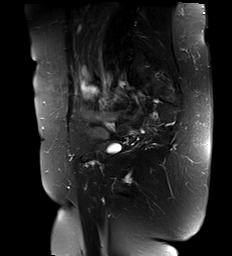
[im 25/42]
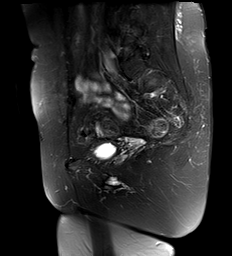
[im 33/42]
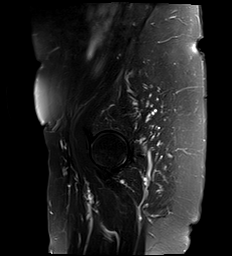
[im 42/42]
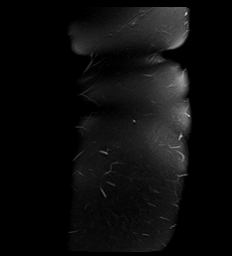

[Series 9: T1 fat-sat · axial · non-contrast · 6.0mm · 0.74mm/px · z∈[-115,+153]mm · 5 of 33 slices shown]
[im 1/33]
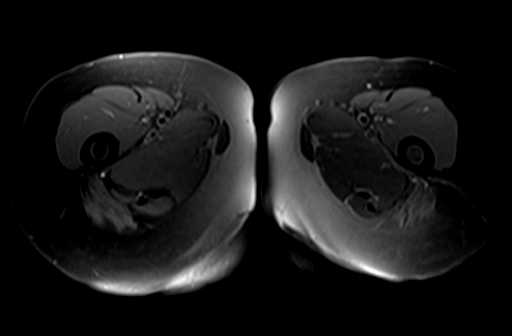
[im 9/33]
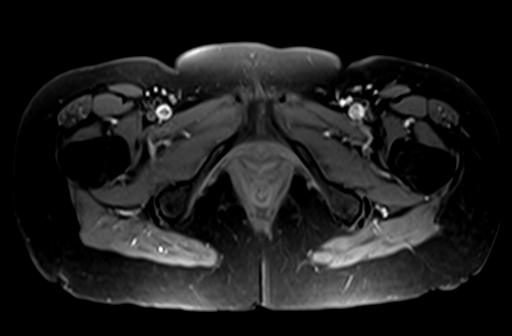
[im 17/33]
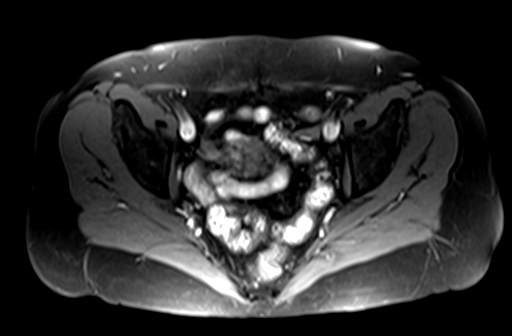
[im 25/33]
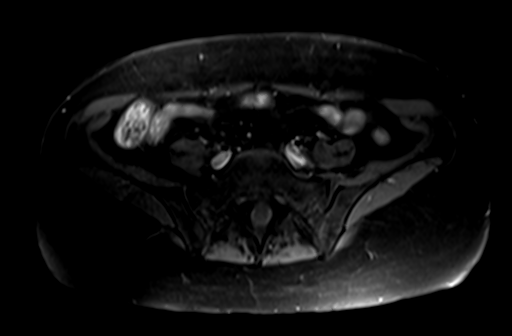
[im 33/33]
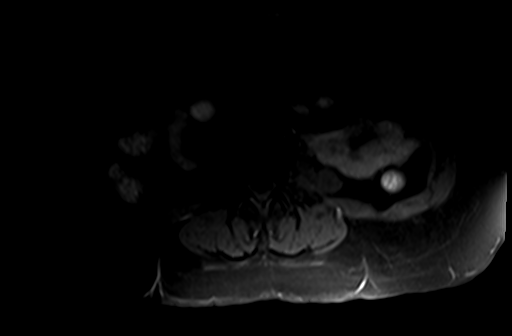

[Series 11: T1 fat-sat post-contrast · axial · 6.0mm · 0.74mm/px · z∈[-115,+86]mm · 4 of 33 slices shown]
[im 1/33]
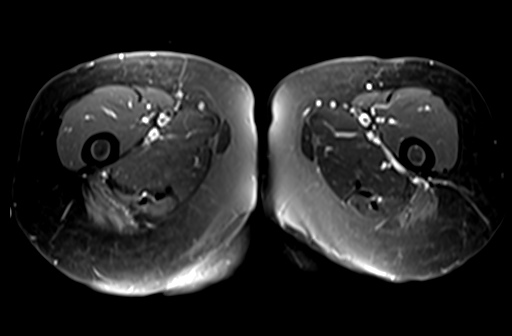
[im 9/33]
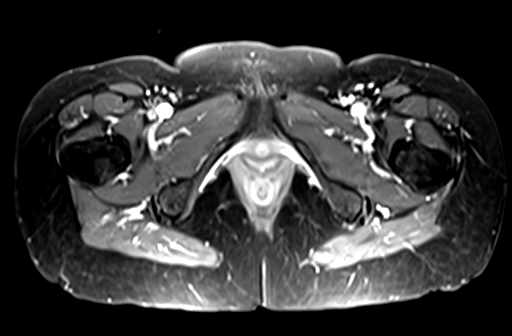
[im 17/33]
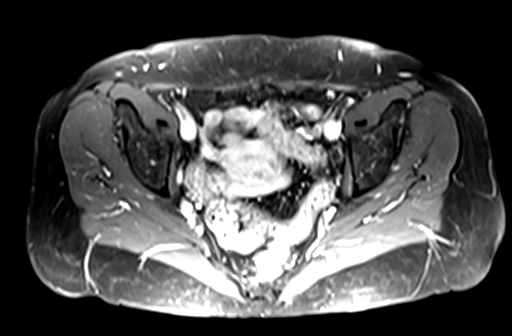
[im 25/33]
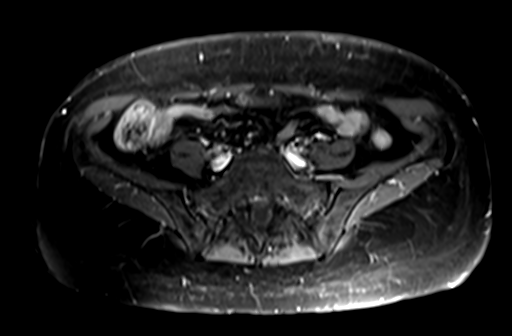

[36 of 48 positions shown; findings below may reference images not displayed]

FINDINGS: Bones: There is no evidence of acute fracture, dislocation or
avascular necrosis. The visualized bony pelvis appears normal. The
visualized sacroiliac joints and symphysis pubis appear normal.

Articular cartilage and labrum

Articular cartilage: No focal chondral defect or subchondral signal
abnormality identified.

Labrum: There is no gross labral tear or paralabral abnormality.

Joint or bursal effusion

Joint effusion: No significant hip joint effusion.

Bursae: No focal periarticular fluid collection.

Muscles and tendons

Muscles and tendons: The visualized gluteus, hamstring and iliopsoas
tendons appear normal. The piriformis muscles appear symmetric.

Other findings

Nerves: The bilateral sciatic nerves are symmetric and unremarkable
in appearance. No abnormal enhancement.

Miscellaneous: The visualized internal pelvic contents appear
unremarkable.
IMPRESSION: 1. No acute abnormality in the pelvis. Unremarkable appearance of
the bilateral sciatic nerves.

## 2020-04-08 ENCOUNTER — Encounter: Payer: Self-pay | Admitting: Neurology

## 2020-04-08 ENCOUNTER — Ambulatory Visit (INDEPENDENT_AMBULATORY_CARE_PROVIDER_SITE_OTHER): Payer: BC Managed Care – PPO | Admitting: Neurology

## 2020-04-08 ENCOUNTER — Other Ambulatory Visit: Payer: Self-pay

## 2020-04-08 DIAGNOSIS — G20 Parkinson's disease: Secondary | ICD-10-CM

## 2020-04-08 DIAGNOSIS — G20A1 Parkinson's disease without dyskinesia, without mention of fluctuations: Secondary | ICD-10-CM

## 2020-04-08 HISTORY — DX: Parkinson's disease: G20

## 2020-04-08 HISTORY — DX: Parkinson's disease without dyskinesia, without mention of fluctuations: G20.A1

## 2020-04-08 NOTE — Progress Notes (Signed)
Reason for visit: Parkinson's disease  Referring physician: Dr. Bartolo Darter is a 64 y.o. female  History of present illness:  Pamela Khan is a 64 year old right-handed white female with a history of a left sided resting tremor affecting the arm and the leg that dates back to around 2015.  The patient was last seen through this office in 2018, we discussed the possibility of Parkinson's disease at that time.  The patient has continued to have ongoing tremors, she was seen by a neurologist in the [place], [place] area and was placed on Sinemet.  She stayed on the medication for about 6 weeks but felt that the tremors worsened on the medication and she stopped the drug.  The patient went to the [facility] in [place], [place] for second opinion and was told that she had Parkinson's disease and that she should go back on the Sinemet.  The patient had not noted any changes in her mobility, she was walking well, she has not had any problems with significant gait instability.  She does not feel stiff on the left arm or left leg.  She has been under a lot of stress as her husband has a progressive dementing illness and she is the sole caretaker.  The patient finds that the tremors worsen when she is under stress and her sensation of imbalance worsens when she is under stress.  She has no family history of tremor.  The patient is currently on Sinemet taking 25/100 mg tablets, 2 tablets 3 times daily.  She tolerates the drug fairly well otherwise.  She denies any numbness or weakness of the extremities, she denies issues controlling the bowels or the bladder.  She does have occasional neck pain.  She fell in September 2018 and she has had some left elbow and hand pain since that time, she was seen by orthopedic surgeon and EMG evaluation was done, she had further evaluation but the source of the pain was never found.  She also has chronic left buttock and posterior thigh and medial  thigh pain that she was seen for through this office in the past.  Past Medical History:  Diagnosis Date  . Anxiety   . Left foot drop 10/19/2017  . Movement disorder   . Nervous   . Tremor, unspecified 10/19/2017   Left arm and leg  . Ventricular pre-excitation     Past Surgical History:  Procedure Laterality Date  . CESAREAN SECTION     x2  . FACIAL COSMETIC SURGERY    . TONSILLECTOMY      Family History  Problem Relation Age of Onset  . Mental illness Other   . Heart disease Other   . Hyperlipidemia Other     Social history:  reports that she has never smoked. She has never used smokeless tobacco. She reports current alcohol use. She reports that she does not use drugs.  Medications:  Prior to Admission medications   Medication Sig Start Date End Date Taking? Authorizing Provider  acetaminophen (TYLENOL) 325 MG tablet Take 650 mg by mouth every 6 (six) hours as needed. For headache   Yes [provider]  aspirin 81 MG tablet Take 81 mg by mouth daily.   Yes [provider]  calcium carbonate (CALCIUM 600) 1500 (600 Ca) MG TABS tablet Take by mouth 2 (two) times daily with a meal.   Yes [provider]  carbidopa-levodopa (SINEMET IR) 25-100 MG tablet carbidopa 25 mg-levodopa 100  mg tablet 03/06/20  Yes [provider]  Cholecalciferol (VITAMIN D3 PO) Vitamin D3   Yes [provider]  oxyCODONE-acetaminophen (PERCOCET/ROXICET) 5-325 MG tablet oxycodone-acetaminophen 5 mg-325 mg tablet  TAKE 1 TABLET BY MOUTH EVERY 4 HOURS AS NEEDED FOR PAIN    [provider]     No Known Allergies  ROS:  Out of a complete 14 system review of symptoms, the patient complains only of the following symptoms, and all other reviewed systems are negative.  Left arm and leg pain Left-sided tremor Anxiety, depression  Temperature (!) 97.3 F (36.3 C), height 5\' 5"  (1.651 m), weight 130 lb 8 oz (59.2 kg).  Physical Exam  General: The  patient is alert and cooperative at the time of the examination.  Eyes: Pupils are equal, round, and reactive to light. Discs are flat bilaterally.  Neck: The neck is supple, no carotid bruits are noted.  Respiratory: The respiratory examination is clear.  Cardiovascular: The cardiovascular examination reveals a regular rate and rhythm, no obvious murmurs or rubs are noted.  Skin: Extremities are without significant edema.  Neurologic Exam  Mental status: The patient is alert and oriented x 3 at the time of the examination. The patient has apparent normal recent and remote memory, with an apparently normal attention span and concentration ability.  Cranial nerves: Facial symmetry is present. There is good sensation of the face to pinprick and soft touch bilaterally. The strength of the facial muscles and the muscles to head turning and shoulder shrug are normal bilaterally. Speech is well enunciated, no aphasia or dysarthria is noted. Extraocular movements are full. Visual fields are full. The tongue is midline, and the patient has symmetric elevation of the soft palate. No obvious hearing deficits are noted.  Mild masking of the face is seen.  Motor: The motor testing reveals 5 over 5 strength of all 4 extremities. Good symmetric motor tone is noted throughout.  Sensory: Sensory testing is intact to pinprick, soft touch, vibration sensation, and position sense on all 4 extremities. No evidence of extinction is noted.  Coordination: Cerebellar testing reveals good finger-nose-finger and heel-to-shin bilaterally.  Resting tremors were noted on the left upper and lower extremities.  Gait and station: Gait is normal.  The patient is able to rise from a seated position with arms crossed.  With walking, she has good symmetric bilateral arm swing.  Tandem gait is normal. Romberg is negative. No drift is seen.  Reflexes: Deep tendon reflexes are symmetric and normal bilaterally. Toes are downgoing  bilaterally.   Assessment/Plan:  1.  Parkinson's disease with left-sided features  The patient really has very good mobility and no other symptoms of Parkinson's disease per se other than the left-sided tremors.  She has developed some mild dyskinesias on the medication, I would reduce the dose.  We will go to 1.5 tablets 3 times daily for 4 weeks and then drop down to 1 tablet 3 times daily of the Sinemet.  She will follow-up here in 6 months.  She is to remain active.  MD 04/08/2020 10:18 AM  [facility] [address] [place], [place]  Phone [phone] Fax [phone]

## 2020-04-08 NOTE — Patient Instructions (Signed)
Reduce the Sinemet 25/100 to 1.5 tablets three times a day for 4 weeks, then go to 1 tablet three times a day.

## 2020-10-06 ENCOUNTER — Encounter: Payer: Self-pay | Admitting: Neurology

## 2020-10-06 ENCOUNTER — Ambulatory Visit (INDEPENDENT_AMBULATORY_CARE_PROVIDER_SITE_OTHER): Payer: BC Managed Care – PPO | Admitting: Neurology

## 2020-10-06 ENCOUNTER — Other Ambulatory Visit: Payer: Self-pay

## 2020-10-06 VITALS — BP 140/87 | HR 89 | Ht 65.0 in | Wt 131.5 lb

## 2020-10-06 DIAGNOSIS — G20 Parkinson's disease: Secondary | ICD-10-CM

## 2020-10-06 MED ORDER — CARBIDOPA-LEVODOPA 25-100 MG PO TABS: 1.0000 | ORAL_TABLET | Freq: Three times a day (TID) | ORAL | 3 refills | Status: DC

## 2020-10-06 NOTE — Progress Notes (Signed)
Reason for visit: Parkinson's disease  Pamela Khan is an 64 y.o. female  History of present illness:  Pamela Khan is a 64 year old right-handed white female with a history of Parkinson's disease.  The patient initially began having problems with tremors affecting the left arm and leg.  The patient has been placed on Sinemet, she went up to the dose of taking 2 of the 25/100 mg tablets 3 times daily but developed dyskinesias.  The patient has been reduced to taking one of the tablets 3 times daily.  She still has dyskinesias, she believes that she is now getting some tremor involving the right side of the body.  She has some slowness with walking, she has not had any stumbles or falls.  She denies any problems with swallowing or choking.  She has been under a lot of stress taking care of her husband who has dementia.  She is considering transferring him to a memory disorders unit so that she can focus on her own health, currently she is not exercising much.  Past Medical History:  Diagnosis Date  . Anxiety   . Left foot drop 10/19/2017  . Movement disorder   . Nervous   . Parkinson's disease (HCC) 04/08/2020  . Tremor, unspecified 10/19/2017   Left arm and leg  . Ventricular pre-excitation     Past Surgical History:  Procedure Laterality Date  . CESAREAN SECTION     x2  . FACIAL COSMETIC SURGERY    . TONSILLECTOMY      Family History  Problem Relation Age of Onset  . Mental illness Other   . Heart disease Other   . Hyperlipidemia Other     Social history:  reports that she has never smoked. She has never used smokeless tobacco. She reports current alcohol use. She reports that she does not use drugs.   No Known Allergies  Medications:  Prior to Admission medications   Medication Sig Start Date End Date Taking? Authorizing Provider  acetaminophen (TYLENOL) 325 MG tablet Take 650 mg by mouth every 6 (six) hours as needed. For headache   Yes [provider]    aspirin 81 MG tablet Take 81 mg by mouth daily.   Yes [provider]  calcium carbonate (CALCIUM 600) 1500 (600 Ca) MG TABS tablet Take by mouth 2 (two) times daily with a meal.   Yes [provider]  carbidopa-levodopa (SINEMET IR) 25-100 MG tablet 1 tablet 3 (three) times daily. 03/06/20  Yes [provider]  Cholecalciferol (VITAMIN D3 PO) Vitamin D3   Yes [provider]  Multiple Vitamin (MULTIVITAMIN ADULT PO) Take by mouth.   Yes [provider]  oxyCODONE-acetaminophen (PERCOCET/ROXICET) 5-325 MG tablet oxycodone-acetaminophen 5 mg-325 mg tablet  TAKE 1 TABLET BY MOUTH EVERY 4 HOURS AS NEEDED FOR PAIN   Yes [provider]    ROS:  Out of a complete 14 system review of symptoms, the patient complains only of the following symptoms, and all other reviewed systems are negative.  Tremors  Blood pressure 140/87, pulse 89, height 5\' 5"  (1.651 m), weight 131 lb 8 oz (59.6 kg).  Physical Exam  General: The patient is alert and cooperative at the time of the examination.  Skin: No significant peripheral edema is noted.   Neurologic Exam  Mental status: The patient is alert and oriented x 3 at the time of the examination. The patient has apparent normal recent and remote memory, with an apparently normal attention  span and concentration ability.   Cranial nerves: Facial symmetry is present. Speech is normal, no aphasia or dysarthria is noted. Extraocular movements are full. Visual fields are full.  Motor: The patient has good strength in all 4 extremities.  Sensory examination: Soft touch sensation is symmetric on the face, arms, and legs.  Coordination: The patient has good finger-nose-finger and heel-to-shin bilaterally.  The patient does have some resting tremor involving the left upper extremity and left lower extremity.  Gait and station: The patient has a normal gait.  She seems to swing both arms symmetrically, some  tremors noted in the left arm with walking.  She is able to rise from a seated position with the arms crossed.  She has excellent stride and good turns.  Tandem gait is normal. Romberg is negative. No drift is seen.  Reflexes: Deep tendon reflexes are symmetric.   Assessment/Plan:  1.  Parkinson's disease  The patient is under a lot of stress to take care of her husband.  She does need to exercise more regularly if possible.  We will continue the current dose of Sinemet taking the 25/100 mg tablets 3 times daily.  She does not wish to go on any medications with dyskinesias such as Symmetrel.  She will follow up here in 6 months.  Marlan Palau MD 10/06/2020 12:04 PM  [facility] [address] [place], [place] [address]  Phone [phone] Fax [phone]

## 2021-04-09 ENCOUNTER — Encounter: Payer: Self-pay | Admitting: Neurology

## 2021-04-09 ENCOUNTER — Ambulatory Visit (INDEPENDENT_AMBULATORY_CARE_PROVIDER_SITE_OTHER): Payer: BC Managed Care – PPO | Admitting: Neurology

## 2021-04-09 VITALS — BP 123/70 | HR 84 | Ht 65.0 in | Wt 130.8 lb

## 2021-04-09 DIAGNOSIS — G20 Parkinson's disease: Secondary | ICD-10-CM

## 2021-04-09 DIAGNOSIS — M79605 Pain in left leg: Secondary | ICD-10-CM | POA: Diagnosis not present

## 2021-04-09 MED ORDER — CARBIDOPA-LEVODOPA 25-100 MG PO TABS: 1.5000 | ORAL_TABLET | Freq: Three times a day (TID) | ORAL | 3 refills | Status: DC

## 2021-04-09 MED ORDER — ESCITALOPRAM OXALATE 10 MG PO TABS
ORAL_TABLET | ORAL | 2 refills | Status: DC
Start: 2021-04-09 — End: 2021-08-12

## 2021-04-09 NOTE — Progress Notes (Signed)
Reason for visit: Parkinson's disease  Pamela Khan is an 65 y.o. female  History of present illness:  Pamela Khan is a 65 year old right-handed white female with a history of Parkinson's disease primarily with left-sided features.  The patient is under a lot of stress to take care of her husband with dementia who is getting worse over time.  She feels trapped in her life, she has a lot of depression and anxiety.  She is not getting a lot of exercise.  She also reports a 5 or 6-year history of chronic thigh pain that is mainly at the insertion point of the hamstring muscles into the knee area.  The patient has in the past apparently had x-rays, orthopedic evaluation, possibly an MRI of the left knee.  The patient apparently has also had EMG evaluation and MRI of the lumbar spine, no source of the leg pain was noted.  The patient has ongoing tremors involving the left arm, she feels as if her walking has slowed down a bit but she has not had any falls.  She takes Benadryl at night for sleep.    Past Medical History:  Diagnosis Date  . Anxiety   . Left foot drop 10/19/2017  . Movement disorder   . Nervous   . Parkinson's disease (HCC) 04/08/2020  . Tremor, unspecified 10/19/2017   Left arm and leg  . Ventricular pre-excitation     Past Surgical History:  Procedure Laterality Date  . CESAREAN SECTION     x2  . FACIAL COSMETIC SURGERY    . TONSILLECTOMY      Family History  Problem Relation Age of Onset  . Mental illness Other   . Heart disease Other   . Hyperlipidemia Other     Social history:  reports that she has never smoked. She has never used smokeless tobacco. She reports current alcohol use. She reports that she does not use drugs.   No Known Allergies  Medications:  Prior to Admission medications   Medication Sig Start Date End Date Taking? Authorizing Provider  acetaminophen (TYLENOL) 325 MG tablet Take 650 mg by mouth every 6 (six) hours as needed. For headache    Yes [provider]  aspirin 81 MG tablet Take 81 mg by mouth daily.   Yes [provider]  b complex vitamins capsule Take 1 capsule by mouth daily.   Yes [provider]  calcium carbonate (OSCAL) 1500 (600 Ca) MG TABS tablet Take by mouth 2 (two) times daily with a meal.   Yes [provider]  carbidopa-levodopa (SINEMET IR) 25-100 MG tablet Take 1 tablet by mouth 3 (three) times daily. 10/06/20  Yes York Spaniel, MD  Cholecalciferol (VITAMIN D3 PO) Vitamin D3   Yes [provider]  Multiple Vitamin (MULTIVITAMIN ADULT PO) Take by mouth.   Yes [provider]  oxyCODONE-acetaminophen (PERCOCET/ROXICET) 5-325 MG tablet oxycodone-acetaminophen 5 mg-325 mg tablet  TAKE 1 TABLET BY MOUTH EVERY 4 HOURS AS NEEDED FOR PAIN   Yes [provider]  Turmeric (QC TUMERIC COMPLEX PO) Take by mouth.   Yes [provider]    ROS:  Out of a complete 14 system review of symptoms, the patient complains only of the following symptoms, and all other reviewed systems are negative.  Anxiety, depression Tremor  Blood pressure 123/70, pulse 84, height 5\' 5"  (1.651 m), weight 130 lb 12.8 oz (59.3 kg).  Physical Exam  General: The patient is alert and cooperative  at the time of the examination.  Skin: No significant peripheral edema is noted.   Neurologic Exam  Mental status: The patient is alert and oriented x 3 at the time of the examination. The patient has apparent normal recent and remote memory, with an apparently normal attention span and concentration ability.   Cranial nerves: Facial symmetry is present. Speech is normal, no aphasia or dysarthria is noted. Extraocular movements are full. Visual fields are full.  Motor: The patient has good strength in all 4 extremities.  Sensory examination: Soft touch sensation is symmetric on the face, arms, and legs.  Coordination: The patient has good finger-nose-finger and  heel-to-shin bilaterally.  Resting tremors are noted with the left arm.  Gait and station: The patient has a normal gait.  Left arm tremors noted with walking.  Tandem gait is normal. Romberg is negative. No drift is seen.  Reflexes: Deep tendon reflexes are symmetric.   Assessment/Plan:  1.  Parkinson's disease  2.  Anxiety and depression  3.  Chronic left leg pain  The patient will call me after she goes on Medicare and we will make a referral for an orthopedic evaluation for her left leg pain.  The patient we placed on Lexapro for anxiety depression.  She will go up on her Sinemet taking 1.5 tablets 3 times daily.  She will follow-up in 4 to 5 months.  Marlan Palau MD 04/09/2021 1:23 PM  [facility] [address] [place], [place] [address]  Phone [phone] Fax [phone]

## 2021-06-18 ENCOUNTER — Other Ambulatory Visit: Payer: Self-pay | Admitting: Neurology

## 2021-06-18 DIAGNOSIS — G8929 Other chronic pain: Secondary | ICD-10-CM

## 2021-06-18 NOTE — Telephone Encounter (Signed)
FYI

## 2021-08-05 ENCOUNTER — Other Ambulatory Visit: Payer: Self-pay | Admitting: *Deleted

## 2021-08-05 MED ORDER — CARBIDOPA-LEVODOPA 25-100 MG PO TABS: 1.5000 | ORAL_TABLET | Freq: Three times a day (TID) | ORAL | 0 refills | Status: DC

## 2021-08-12 ENCOUNTER — Other Ambulatory Visit: Payer: Self-pay

## 2021-08-12 MED ORDER — ESCITALOPRAM OXALATE 10 MG PO TABS: ORAL_TABLET | ORAL | 2 refills | Status: DC

## 2021-08-13 ENCOUNTER — Other Ambulatory Visit: Payer: Self-pay

## 2021-08-13 MED ORDER — ESCITALOPRAM OXALATE 10 MG PO TABS: ORAL_TABLET | ORAL | 2 refills | Status: DC

## 2021-08-13 NOTE — Telephone Encounter (Signed)
I called Clint, pharmacy and [facility]. He confirmed rx has not been received I have re sent. Confirmation received.

## 2021-09-08 ENCOUNTER — Ambulatory Visit: Payer: Self-pay | Admitting: Neurology

## 2021-09-14 ENCOUNTER — Ambulatory Visit: Payer: Self-pay | Admitting: Neurology

## 2021-10-08 ENCOUNTER — Ambulatory Visit (INDEPENDENT_AMBULATORY_CARE_PROVIDER_SITE_OTHER): Payer: Medicare Other | Admitting: Neurology

## 2021-10-08 ENCOUNTER — Encounter: Payer: Self-pay | Admitting: Neurology

## 2021-10-08 VITALS — BP 126/70 | HR 82 | Ht 65.0 in

## 2021-10-08 DIAGNOSIS — G20 Parkinson's disease: Secondary | ICD-10-CM

## 2021-10-08 MED ORDER — ESCITALOPRAM OXALATE 20 MG PO TABS: 20.0000 mg | ORAL_TABLET | Freq: Every day | ORAL | 1 refills | Status: DC

## 2021-10-08 MED ORDER — CARBIDOPA-LEVODOPA 25-100 MG PO TABS: ORAL_TABLET | ORAL | 1 refills | Status: DC

## 2021-10-08 NOTE — Progress Notes (Signed)
Reason for visit: Parkinson's disease  Pamela Khan is an 64 y.o. female  History of present illness:  Ms. Reas is a 65 year old right-handed white female with a history of Parkinson's disease primarily with left-sided features.  The patient has tremors involving the left arm and left leg.  She is on Sinemet prescribed to take 1.5 tablets 3 times daily of the 25/100 mg tablets.  The patient is actually taking 2 tablets twice a day.  The patient is concerned about dyskinesias, she may have some mild movement changes in the head and neck.  She is not able to stay very active as she is caring for her husband with dementia.  She has chronic issues with left leg pain around the knee and in the back of the thigh.  She had an orthopedic evaluation and no source of the pain was determined.  She believes that she had MRI of the knee and hip and in the past she has had MRI of the lumbar spine without delineating the source of pain.  She is on Lexapro for anxiety and depression, she is on 20 mg a day.  She believes that this does help.  She reports no difficulty with handwriting.  She returns for further evaluation.  Past Medical History:  Diagnosis Date   Anxiety    Left foot drop 10/19/2017   Movement disorder    Nervous    Parkinson's disease (HCC) 04/08/2020   Tremor, unspecified 10/19/2017   Left arm and leg   Ventricular pre-excitation    WPW (Wolff-Parkinson-White syndrome)     Past Surgical History:  Procedure Laterality Date   CESAREAN SECTION     x2   FACIAL COSMETIC SURGERY     TONSILLECTOMY      Family History  Problem Relation Age of Onset   Mental illness Other    Heart disease Other    Hyperlipidemia Other     Social history:  reports that she has never smoked. She has never used smokeless tobacco. She reports current alcohol use. She reports that she does not use drugs.   No Known Allergies  Medications:  Prior to Admission medications   Medication Sig  Start Date End Date Taking? Authorizing Provider  acetaminophen (TYLENOL) 325 MG tablet Take 650 mg by mouth every 6 (six) hours as needed. For headache   Yes [provider]  aspirin 81 MG tablet Take 81 mg by mouth daily.   Yes [provider]  calcium carbonate (OSCAL) 1500 (600 Ca) MG TABS tablet Take by mouth 2 (two) times daily with a meal.   Yes [provider]  carbidopa-levodopa (SINEMET IR) 25-100 MG tablet Take 1.5 tablets by mouth 3 (three) times daily. 08/05/21  Yes York Spaniel, MD  Cholecalciferol (VITAMIN D3 PO) Vitamin D3   Yes [provider]  escitalopram (LEXAPRO) 10 MG tablet 2 tablets daily 08/13/21  Yes York Spaniel, MD  Multiple Vitamin (MULTIVITAMIN ADULT PO) Take by mouth.   Yes [provider]  Turmeric (QC TUMERIC COMPLEX PO) Take by mouth.   Yes [provider]    ROS:  Out of a complete 14 system review of symptoms, the patient complains only of the following symptoms, and all other reviewed systems are negative.  Tremor Anxiety and depression  Blood pressure 126/70, pulse 82, height 5\' 5"  (1.651 m).  Physical Exam  General: The patient is alert and cooperative at the time of the examination.  Skin: No  significant peripheral edema is noted.   Neurologic Exam  Mental status: The patient is alert and oriented x 3 at the time of the examination. The patient has apparent normal recent and remote memory, with an apparently normal attention span and concentration ability.   Cranial nerves: Facial symmetry is present. Speech is normal, no aphasia or dysarthria is noted. Extraocular movements are full. Visual fields are full.  Motor: The patient has good strength in all 4 extremities.  Sensory examination: Soft touch sensation is symmetric on the face, arms, and legs.  Coordination: The patient has good finger-nose-finger and heel-to-shin bilaterally.  Tremors are noted on the left upper and left  lower extremities.  Gait and station: The patient has a normal gait, fairly good arm swing bilaterally but tremor seen in the left hand while walking.  The patient is able to rise from a seated position with arms crossed.. Tandem gait is slightly unsteady. Romberg is negative. No drift is seen.  Reflexes: Deep tendon reflexes are symmetric.   Assessment/Plan:  1.  Parkinson's disease  2.  Anxiety and depression  The patient is doing fairly well on low-dose Sinemet, she will reduce the dose to 1.5 tablets in the morning and evening and 1 tablet midday.  The patient will follow-up in about 5 months, will consider adding Mirapex or Azilect in the future to her regimen.  The patient is to remain as active as possible.  Marlan Palau MD 10/08/2021 2:17 PM  [facility] [address] [place], [place] [address]  Phone [phone] Fax [phone]

## 2022-03-07 ENCOUNTER — Encounter: Payer: Self-pay | Admitting: Diagnostic Neuroimaging

## 2022-03-08 MED ORDER — CARBIDOPA-LEVODOPA 25-100 MG PO TABS: 2.0000 | ORAL_TABLET | Freq: Three times a day (TID) | ORAL | 4 refills | Status: DC

## 2022-03-08 NOTE — Telephone Encounter (Signed)
Meds ordered this encounter  ?Medications  ? carbidopa-levodopa (SINEMET IR) 25-100 MG tablet  ?  Sig: Take 2 tablets by mouth 3 (three) times daily.  ?  Dispense:  540 tablet  ?  Refill:  4  ? ? ?Suanne Marker, MD 03/08/2022, 5:04 PM ?Certified in Neurology, Neurophysiology and Neuroimaging ? ?[facility] ?[address], [address] ?[place], [place] [address] ?[phone] ? ?

## 2022-04-07 ENCOUNTER — Encounter: Payer: Self-pay | Admitting: Diagnostic Neuroimaging

## 2022-04-07 ENCOUNTER — Ambulatory Visit (INDEPENDENT_AMBULATORY_CARE_PROVIDER_SITE_OTHER): Payer: Medicare Other | Admitting: Diagnostic Neuroimaging

## 2022-04-07 ENCOUNTER — Ambulatory Visit: Payer: Medicare Other | Admitting: Diagnostic Neuroimaging

## 2022-04-07 VITALS — BP 119/67 | HR 84 | Ht 65.0 in | Wt 129.0 lb

## 2022-04-07 DIAGNOSIS — G20 Parkinson's disease: Secondary | ICD-10-CM

## 2022-04-07 MED ORDER — CARBIDOPA-LEVODOPA 25-100 MG PO TABS: 2.0000 | ORAL_TABLET | Freq: Three times a day (TID) | ORAL | 4 refills | Status: DC

## 2022-04-07 MED ORDER — RASAGILINE MESYLATE 0.5 MG PO TABS: 0.5000 mg | ORAL_TABLET | Freq: Every day | ORAL | 12 refills | Status: DC

## 2022-04-07 NOTE — Patient Instructions (Signed)
?  PARKINSON'S DISEASE ? ?- continue carb/levo 2 tabs three times a day  ? ?- add azilect 0.5mg  daily ? ?- consider DBS evaluation ?

## 2022-04-07 NOTE — Progress Notes (Signed)
? ?[facility] ? ?PATIENT: Pamela Khan ?DOB: [birth year] ? ?REFERRING CLINICIAN: Angelica ChessmanAguiar, Rafaela M, MD ?HISTORY FROM: patient ?REASON FOR VISIT: new consult  ? ? ?HISTORICAL ? ?CHIEF COMPLAINT:  ?Chief Complaint  ?Patient presents with  ? Follow-up  ?  Rm 7 alone ?Pt is well, states her symptoms has worsen since last visit. More tremors and pain especially in L leg   ? ? ?HISTORY OF PRESENT ILLNESS:  ? ?66 year old female here for evaluation of Parkinson's disease. ? ?Patient had onset of pain in the left lower extremity around 2016.  She then developed tremor in the left upper extremity.  Also had some issues with coordination.  She saw several neurologists and had MRI of the lumbar spine, EMG nerve conduction study, which were unremarkable.  Ultimately she was diagnosed with idiopathic Parkinson disease and prescribed carbidopa levodopa.  Initially she took 1 tablet 3 times a day but did not see improvement and thought symptoms were worsening so she stopped the medicine.  Later on she resumed medication and has titrated up to 2 tablets 3 times a day with some good results. ? ?Also having some issues related to caregiver stress, taking care of her 66 year old husband with significant dementia.  [place] in [place] with some family support from her daughter and her family.  She has a son who lives in [place]. ? ? ? ?REVIEW OF SYSTEMS: Full 14 system review of systems performed and negative with exception of: as per HPI. ? ?ALLERGIES: ?No Known Allergies ? ?HOME MEDICATIONS: ?Outpatient Medications Prior to Visit  ?Medication Sig Dispense Refill  ? acetaminophen (TYLENOL) 325 MG tablet Take 650 mg by mouth every 6 (six) hours as needed. For headache    ? aspirin 81 MG tablet Take 81 mg by mouth daily.    ? calcium carbonate (OSCAL) 1500 (600 Ca) MG TABS tablet Take by mouth 2 (two) times daily with a meal.    ? Cholecalciferol (VITAMIN D3 PO) Vitamin D3    ?  escitalopram (LEXAPRO) 20 MG tablet Take 1 tablet (20 mg total) by mouth daily. 90 tablet 1  ? Multiple Vitamin (MULTIVITAMIN ADULT PO) Take by mouth.    ? Turmeric (QC TUMERIC COMPLEX PO) Take by mouth.    ? carbidopa-levodopa (SINEMET IR) 25-100 MG tablet Take 2 tablets by mouth 3 (three) times daily. 540 tablet 4  ? ?No facility-administered medications prior to visit.  ? ? ?PAST MEDICAL HISTORY: ?Past Medical History:  ?Diagnosis Date  ? Anxiety   ? Left foot drop 10/19/2017  ? Movement disorder   ? Nervous   ? Parkinson's disease (HCC) 04/08/2020  ? Tremor, unspecified 10/19/2017  ? Left arm and leg  ? Ventricular pre-excitation   ? WPW (Wolff-Parkinson-White syndrome)   ? ? ?PAST SURGICAL HISTORY: ?Past Surgical History:  ?Procedure Laterality Date  ? CESAREAN SECTION    ? x2  ? FACIAL COSMETIC SURGERY    ? TONSILLECTOMY    ? ? ?FAMILY HISTORY: ?Family History  ?Problem Relation Age of Onset  ? Mental illness Other   ? Heart disease Other   ? Hyperlipidemia Other   ? ? ?SOCIAL HISTORY: ?Social History  ? ?Socioeconomic History  ? Marital status: Married  ?  Spouse name: Rayna SextonRalph  ? Number of children: 2  ? Years of education: 3213  ? Highest education level: Not on file  ?Occupational History  ? Occupation: Retired  ?Tobacco Use  ? Smoking status: Never  ?  Smokeless tobacco: Never  ?Vaping Use  ? Vaping Use: Never used  ?Substance and Sexual Activity  ? Alcohol use: Yes  ?  Comment: occasional  ? Drug use: No  ? Sexual activity: Not on file  ?Other Topics Concern  ? Not on file  ?Social History Narrative  ? Lives with husband who has dementia  ? Caffeine use: Coffee- 1 per day  ? Right handed   ? ?Social Determinants of Health  ? ?Financial Resource Strain: Not on file  ?Food Insecurity: Not on file  ?Transportation Needs: Not on file  ?Physical Activity: Not on file  ?Stress: Not on file  ?Social Connections: Not on file  ?Intimate Partner Violence: Not on file  ? ? ? ?PHYSICAL EXAM ? ?GENERAL  EXAM/CONSTITUTIONAL: ?Vitals:  ?Vitals:  ? 04/07/22 1119  ?BP: 119/67  ?Pulse: 84  ?Weight: 129 lb (58.5 kg)  ?Height: 5\' 5"  (1.651 m)  ? ?Body mass index is 21.47 kg/m?. ?Wt Readings from Last 3 Encounters:  ?04/07/22 129 lb (58.5 kg)  ?04/09/21 130 lb 12.8 oz (59.3 kg)  ?10/06/20 131 lb 8 oz (59.6 kg)  ? ?Patient is in no distress; well developed, nourished and groomed; neck is supple ? ?CARDIOVASCULAR: ?Examination of carotid arteries is normal; no carotid bruits ?Regular rate and rhythm, no murmurs ?Examination of peripheral vascular system by observation and palpation is normal ? ?EYES: ?Ophthalmoscopic exam of optic discs and posterior segments is normal; no papilledema or hemorrhages ?No results found. ? ?MUSCULOSKELETAL: ?Gait, strength, tone, movements noted in Neurologic exam below ? ?NEUROLOGIC: ?MENTAL STATUS:  ?   ? View : No data to display.  ?  ?  ?  ? ?awake, alert, oriented to person, place and time ?recent and remote memory intact ?normal attention and concentration ?language fluent, comprehension intact, naming intact ?fund of knowledge appropriate ? ?CRANIAL NERVE:  ?2nd - no papilledema on fundoscopic exam ?2nd, 3rd, 4th, 6th - pupils equal and reactive to light, visual fields full to confrontation, extraocular muscles intact, no nystagmus ?5th - facial sensation symmetric ?7th - facial strength symmetric ?8th - hearing intact ?9th - palate elevates symmetrically, uvula midline ?11th - shoulder shrug symmetric ?12th - tongue protrusion midline ? ?MOTOR:  ?MILD GENERALIZED DYSKINESIA IN MOUTH, ARMS, LEGS ?MILD RESTING TREMOR IN BUE ?MILD BRADYKINESIA ?MILD COGWHEELING RIGIDITY ?normal bulk and tone, full strength in the BUE, BLE ? ?SENSORY:  ?normal and symmetric to light touch, temperature, vibration ? ?COORDINATION:  ?finger-nose-finger, fine finger movements normal ? ?REFLEXES:  ?deep tendon reflexes TRACE and symmetric ? ?GAIT/STATION:  ?narrow based gait; DECR ARM SWING ? ? ? ? ?DIAGNOSTIC  DATA (LABS, IMAGING, TESTING) ?- I reviewed patient records, labs, notes, testing and imaging myself where available. ? ?No results found for: WBC, HGB, HCT, MCV, PLT ?No results found for: NA, K, CL, CO2, GLUCOSE, BUN, CREATININE, CALCIUM, PROT, ALBUMIN, AST, ALT, ALKPHOS, BILITOT, GFRNONAA, GFRAA ?No results found for: CHOL, HDL, LDLCALC, LDLDIRECT, TRIG, CHOLHDL ?No results found for: HGBA1C ?No results found for: VITAMINB12 ?No results found for: TSH ? ? 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 ? ? ?ASSESSMENT AND PLAN ? ?66 y.o. year old female here with: ? ? ?Dx: ? ?1. Parkinson's disease (HCC)   ? ? ? ?PLAN: ? ?PARKINSON'S DISEASE ?- continue carb/levo 2 tabs three times a day (some mild wearing off phenomenon) ?- add azilect 0.5mg  daily ?- consider DBS evaluation ?- counseled patient on caregiver stress,  support and resources ? ?No orders of the defined types were placed in this encounter. ? ? ?Meds ordered this encounter  ?Medications  ? carbidopa-levodopa (SINEMET IR) 25-100 MG tablet  ?  Sig: Take 2 tablets by mouth 3 (three) times daily.  ?  Dispense:  540 tablet  ?  Refill:  4  ? rasagiline (AZILECT) 0.5 MG TABS tablet  ?  Sig: Take 1 tablet (0.5 mg total) by mouth daily.  ?  Dispense:  30 tablet  ?  Refill:  12  ? ? ?Return in about 1 year (around 04/08/2023) for MyChart visit (15 min). ? ?I spent 43 minutes of face-to-face and non-face-to-face time with patient.  This included previsit chart review, lab review, study review, order entry, electronic health record documentation, patient education.   ? ? ?Suanne Marker, MD 04/07/2022, 12:26 PM ?Certified in Neurology, Neurophysiology and Neuroimaging ? ?[facility] ?[address], [address] ?[place], [place] [address] ?([phone] ? ?

## 2022-05-07 ENCOUNTER — Encounter: Payer: Self-pay | Admitting: Diagnostic Neuroimaging

## 2022-05-19 ENCOUNTER — Telehealth: Payer: Self-pay | Admitting: *Deleted

## 2022-05-19 MED ORDER — ESCITALOPRAM OXALATE 20 MG PO TABS: 20.0000 mg | ORAL_TABLET | Freq: Every day | ORAL | 1 refills | Status: DC

## 2022-05-19 NOTE — Telephone Encounter (Signed)
Per Dr. Leta Baptist, ok to refill escitalopram 20 mg as previoulsy rx'd by Dr. Aletha Halim. Refill has been sent to [facility].

## 2022-07-06 ENCOUNTER — Other Ambulatory Visit: Payer: Self-pay | Admitting: *Deleted

## 2022-07-06 ENCOUNTER — Encounter: Payer: Self-pay | Admitting: Diagnostic Neuroimaging

## 2022-07-06 DIAGNOSIS — G20 Parkinson's disease: Secondary | ICD-10-CM

## 2022-07-06 MED ORDER — RASAGILINE MESYLATE 1 MG PO TABS: 1.0000 mg | ORAL_TABLET | Freq: Every day | ORAL | 3 refills | Status: DC

## 2022-07-15 ENCOUNTER — Telehealth: Payer: Self-pay | Admitting: *Deleted

## 2022-07-15 MED ORDER — RYTARY 23.75-95 MG PO CPCR: 3.0000 | ORAL_CAPSULE | Freq: Three times a day (TID) | ORAL | 4 refills | Status: DC

## 2022-07-15 NOTE — Addendum Note (Signed)
Addended by: Joycelyn Schmid R on: 07/15/2022 01:07 PM   Modules accepted: Orders

## 2022-07-15 NOTE — Telephone Encounter (Signed)
Received fax from [facility] re: Rytary PA, formulary exception. Waiting on Rx to complete paperwork.

## 2022-07-15 NOTE — Telephone Encounter (Signed)
Rytary Rx has been sent. PA forms completed, on MD desk for review, signatures.

## 2022-07-15 NOTE — Telephone Encounter (Signed)
Meds ordered this encounter  Medications   Carbidopa-Levodopa ER (RYTARY) 23.75-95 MG CPCR    Sig: Take 3 capsules by mouth 3 (three) times daily before meals.    Dispense:  270 capsule    Refill:  4   Change carb/levo to rytary (23.75/95) 3 caps three times a day. After 1-2 weeks, can increase to 4 tabs three times a day.   Suanne Marker, MD 07/15/2022, 1:05 PM Certified in Neurology, Neurophysiology and Neuroimaging  [facility] [address], [address] [place], [place] [address] [phone]

## 2022-07-15 NOTE — Telephone Encounter (Signed)
Rytary forms signed and faxed to [facility]. Received confirmation.

## 2022-12-04 ENCOUNTER — Other Ambulatory Visit: Payer: Self-pay | Admitting: Diagnostic Neuroimaging

## 2023-01-11 ENCOUNTER — Encounter: Payer: Self-pay | Admitting: Diagnostic Neuroimaging

## 2023-01-11 ENCOUNTER — Telehealth: Payer: Self-pay | Admitting: Diagnostic Neuroimaging

## 2023-01-11 DIAGNOSIS — G20A1 Parkinson's disease without dyskinesia, without mention of fluctuations: Secondary | ICD-10-CM

## 2023-01-11 NOTE — Telephone Encounter (Signed)
Referral for physical therapy fax to [facility] [place], [place] as requested by patient. Phone: [phone], Phone: [phone].

## 2023-02-28 ENCOUNTER — Other Ambulatory Visit: Payer: Self-pay | Admitting: Neurology

## 2023-02-28 ENCOUNTER — Encounter: Payer: Self-pay | Admitting: Diagnostic Neuroimaging

## 2023-02-28 MED ORDER — RYTARY 23.75-95 MG PO CPCR: 3.0000 | ORAL_CAPSULE | Freq: Three times a day (TID) | ORAL | 1 refills | Status: DC

## 2023-03-07 ENCOUNTER — Other Ambulatory Visit: Payer: Self-pay

## 2023-03-07 MED ORDER — RYTARY 23.75-95 MG PO CPCR: 3.0000 | ORAL_CAPSULE | Freq: Three times a day (TID) | ORAL | 1 refills | Status: DC

## 2023-03-09 ENCOUNTER — Other Ambulatory Visit: Payer: Self-pay | Admitting: Diagnostic Neuroimaging

## 2023-03-10 ENCOUNTER — Other Ambulatory Visit: Payer: Self-pay | Admitting: Neurology

## 2023-03-10 MED ORDER — CARBIDOPA-LEVODOPA 25-100 MG PO TABS: 2.0000 | ORAL_TABLET | Freq: Three times a day (TID) | ORAL | 0 refills | Status: DC

## 2023-03-10 NOTE — Addendum Note (Signed)
Addended by: Darleen Crocker on: 03/10/2023 01:30 PM   Modules accepted: Orders

## 2023-04-05 ENCOUNTER — Encounter: Payer: Self-pay | Admitting: Diagnostic Neuroimaging

## 2023-04-11 ENCOUNTER — Telehealth: Payer: Self-pay | Admitting: Diagnostic Neuroimaging

## 2023-04-11 ENCOUNTER — Encounter: Payer: Self-pay | Admitting: Diagnostic Neuroimaging

## 2023-04-11 ENCOUNTER — Telehealth (INDEPENDENT_AMBULATORY_CARE_PROVIDER_SITE_OTHER): Payer: Medicare Other | Admitting: Diagnostic Neuroimaging

## 2023-04-11 DIAGNOSIS — G20B2 Parkinson's disease with dyskinesia, with fluctuations: Secondary | ICD-10-CM

## 2023-04-11 DIAGNOSIS — G20A1 Parkinson's disease without dyskinesia, without mention of fluctuations: Secondary | ICD-10-CM

## 2023-04-11 MED ORDER — CARBIDOPA-LEVODOPA 25-100 MG PO TABS: 2.0000 | ORAL_TABLET | Freq: Two times a day (BID) | ORAL | 4 refills | Status: DC

## 2023-04-11 MED ORDER — ESCITALOPRAM OXALATE 20 MG PO TABS: 20.0000 mg | ORAL_TABLET | Freq: Every day | ORAL | 4 refills | Status: AC

## 2023-04-11 MED ORDER — RASAGILINE MESYLATE 1 MG PO TABS: 1.0000 mg | ORAL_TABLET | Freq: Every day | ORAL | 4 refills | Status: AC

## 2023-04-11 NOTE — Patient Instructions (Signed)
  PARKINSON'S DISEASE - continue carb/levo 2 tabs twice a day - continue rasagiline 0.5mg  daily - will refer for DBS evaluation (patient requesting [facility]; has a cousin that lives near that area)

## 2023-04-11 NOTE — Progress Notes (Signed)
[facility]  PATIENT: Pamela Khan DOB: 1956-11-24  REFERRING CLINICIAN: Angelica Chessman, MD HISTORY FROM: patient REASON FOR VISIT: follow up   HISTORICAL  CHIEF COMPLAINT:  Chief Complaint  Patient presents with   parkinson's     HISTORY OF PRESENT ILLNESS:    UPDATE (04/11/23, VRP): Since last visit, noticed more issues with freezing (mainly with taking carb/levo). Now taking 2 tabs in afternoon, and then 2 tabs in the PM. More falls. Using walking sticks; now has a walker.  PRIOR HPI (04/07/22): 67 year old female here for evaluation of Parkinson's disease.  Patient had onset of pain in the left lower extremity around 2016.  She then developed tremor in the left upper extremity.  Also had some issues with coordination.  She saw several neurologists and had MRI of the lumbar spine, EMG nerve conduction study, which were unremarkable.  Ultimately she was diagnosed with idiopathic Parkinson disease and prescribed carbidopa levodopa.  Initially she took 1 tablet 3 times a day but did not see improvement and thought symptoms were worsening so she stopped the medicine.  Later on she resumed medication and has titrated up to 2 tablets 3 times a day with some good results.  Also having some issues related to caregiver stress, taking care of her 52 year old husband with significant dementia.  They live in [place] with some family support from her daughter and her family.  She has a son who lives in [place].    REVIEW OF SYSTEMS: Full 14 system review of systems performed and negative with exception of: as per HPI.  ALLERGIES: No Known Allergies  HOME MEDICATIONS: Outpatient Medications Prior to Visit  Medication Sig Dispense Refill   acetaminophen (TYLENOL) 325 MG tablet Take 650 mg by mouth every 6 (six) hours as needed. For headache     aspirin 81 MG tablet Take 81 mg by mouth daily.     calcium carbonate (OSCAL) 1500  (600 Ca) MG TABS tablet Take by mouth 2 (two) times daily with a meal.     Cholecalciferol (VITAMIN D3 PO) Vitamin D3     Multiple Vitamin (MULTIVITAMIN ADULT PO) Take by mouth.     Turmeric (QC TUMERIC COMPLEX PO) Take by mouth.     carbidopa-levodopa (SINEMET IR) 25-100 MG tablet Take 2 tablets by mouth 3 (three) times daily. 180 tablet 0   escitalopram (LEXAPRO) 20 MG tablet TAKE ONE TABLET BY MOUTH EVERY DAY 90 tablet 1   rasagiline (AZILECT) 1 MG TABS tablet Take 1 tablet (1 mg total) by mouth daily. 90 tablet 3   No facility-administered medications prior to visit.    PAST MEDICAL HISTORY: Past Medical History:  Diagnosis Date   Anxiety    Left foot drop 10/19/2017   Movement disorder    Nervous    Parkinson's disease (HCC) 04/08/2020   Tremor, unspecified 10/19/2017   Left arm and leg   Ventricular pre-excitation    WPW (Wolff-Parkinson-White syndrome)     PAST SURGICAL HISTORY: Past Surgical History:  Procedure Laterality Date   CESAREAN SECTION     x2   FACIAL COSMETIC SURGERY     TONSILLECTOMY      FAMILY HISTORY: Family History  Problem Relation Age of Onset   Mental illness Other    Heart disease Other    Hyperlipidemia Other     SOCIAL HISTORY: Social History   Socioeconomic History   Marital status: Married    Spouse name: Rayna Sexton  Number of children: 2   Years of education: 13   Highest education level: Not on file  Occupational History   Occupation: Retired  Tobacco Use   Smoking status: Never   Smokeless tobacco: Never  Vaping Use   Vaping Use: Never used  Substance and Sexual Activity   Alcohol use: Yes    Comment: occasional   Drug use: No   Sexual activity: Not on file  Other Topics Concern   Not on file  Social History Narrative   Lives with husband who has dementia   Caffeine use: Coffee- 1 per day   Right handed    Social Determinants of Health   Financial Resource Strain: Not on file  Food Insecurity: Not on file   Transportation Needs: Not on file  Physical Activity: Not on file  Stress: Not on file  Social Connections: Not on file  Intimate Partner Violence: Not on file     PHYSICAL EXAM  VIDEO VISIT: exam from 04/07/22 visit:  GENERAL EXAM/CONSTITUTIONAL: Vitals:  There were no vitals filed for this visit.  There is no height or weight on file to calculate BMI. Wt Readings from Last 3 Encounters:  04/07/22 129 lb (58.5 kg)  04/09/21 130 lb 12.8 oz (59.3 kg)  10/06/20 131 lb 8 oz (59.6 kg)   Patient is in no distress; well developed, nourished and groomed; neck is supple  CARDIOVASCULAR: Examination of carotid arteries is normal; no carotid bruits Regular rate and rhythm, no murmurs Examination of peripheral vascular system by observation and palpation is normal  EYES: Ophthalmoscopic exam of optic discs and posterior segments is normal; no papilledema or hemorrhages No results found.  MUSCULOSKELETAL: Gait, strength, tone, movements noted in Neurologic exam below  NEUROLOGIC: MENTAL STATUS:      No data to display         awake, alert, oriented to person, place and time recent and remote memory intact normal attention and concentration language fluent, comprehension intact, naming intact fund of knowledge appropriate  CRANIAL NERVE:  2nd - no papilledema on fundoscopic exam 2nd, 3rd, 4th, 6th - pupils equal and reactive to light, visual fields full to confrontation, extraocular muscles intact, no nystagmus 5th - facial sensation symmetric 7th - facial strength symmetric 8th - hearing intact 9th - palate elevates symmetrically, uvula midline 11th - shoulder shrug symmetric 12th - tongue protrusion midline  MOTOR:  MILD GENERALIZED DYSKINESIA IN MOUTH, ARMS, LEGS MILD RESTING TREMOR IN BUE MILD BRADYKINESIA MILD COGWHEELING RIGIDITY normal bulk and tone, full strength in the BUE, BLE  SENSORY:  normal and symmetric to light touch, temperature,  vibration  COORDINATION:  finger-nose-finger, fine finger movements normal  REFLEXES:  deep tendon reflexes TRACE and symmetric  GAIT/STATION:  narrow based gait; DECR ARM SWING     DIAGNOSTIC DATA (LABS, IMAGING, TESTING) - I reviewed patient records, labs, notes, testing and imaging myself where available.  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 year old female here with:   Dx:  1. Parkinson's disease with dyskinesia and fluctuating manifestations   2. Parkinson's disease  PLAN:  PARKINSON'S DISEASE - continue carb/levo 2 tabs twice a day - continue rasagiline 1mg  daily - will refer for DBS evaluation (patient requesting [facility]; has a cousin that lives near that area)  Orders Placed This Encounter  Procedures   Ambulatory referral to Neurology   Meds ordered this encounter  Medications   carbidopa-levodopa (SINEMET IR) 25-100 MG tablet    Sig: Take 2 tablets by mouth in the morning and at bedtime.    Dispense:  360 tablet    Refill:  4   rasagiline (AZILECT) 1 MG TABS tablet    Sig: Take 1 tablet (1 mg total) by mouth daily.    Dispense:  90 tablet    Refill:  4   escitalopram (LEXAPRO) 20 MG tablet    Sig: Take 1 tablet (20 mg total) by mouth daily.    Dispense:  90 tablet    Refill:  4   Return in about 9 months (around 01/11/2024).  Virtual Visit via Video Note  I connected with JAIYANNA SAFRAN on 04/11/23 at  1:45 PM EDT by a video enabled telemedicine application and verified  that I am speaking with the correct person using two identifiers.   I discussed the limitations of evaluation and management by telemedicine and the availability of in person appointments. The patient expressed understanding and agreed to proceed.  Patient is at home and I am at the office.   I spent 25 minutes of face-to-face and non-face-to-face time with patient.  This included previsit chart review, lab review, study review, order entry, electronic health record documentation, patient education.        Suanne Marker, MD 04/11/2023, 1:48 PM Certified in Neurology, Neurophysiology and Neuroimaging  [facility] [address], [address] [place], [place] [address] [phone]

## 2023-04-11 NOTE — Telephone Encounter (Signed)
Neurology referral faxed to [facility] (fax# [phone], phone# [phone]) 

## 2023-05-06 ENCOUNTER — Encounter: Payer: Self-pay | Admitting: Diagnostic Neuroimaging

## 2023-05-19 ENCOUNTER — Encounter: Payer: Self-pay | Admitting: Diagnostic Neuroimaging

## 2023-05-31 ENCOUNTER — Encounter: Payer: Self-pay | Admitting: Diagnostic Neuroimaging

## 2023-05-31 DIAGNOSIS — G20B2 Parkinson's disease with dyskinesia, with fluctuations: Secondary | ICD-10-CM

## 2023-05-31 DIAGNOSIS — G8929 Other chronic pain: Secondary | ICD-10-CM

## 2023-06-01 ENCOUNTER — Telehealth: Payer: Self-pay | Admitting: Diagnostic Neuroimaging

## 2023-06-01 NOTE — Telephone Encounter (Signed)
Referral faxed to [facility]: Phone [phone]  Fax: [phone] 

## 2023-09-07 ENCOUNTER — Encounter: Payer: Self-pay | Admitting: Diagnostic Neuroimaging

## 2024-02-09 ENCOUNTER — Other Ambulatory Visit: Payer: Self-pay | Admitting: Diagnostic Neuroimaging

## 2024-02-09 DIAGNOSIS — G20B2 Parkinson's disease with dyskinesia, with fluctuations: Secondary | ICD-10-CM

## 2024-02-09 NOTE — Telephone Encounter (Signed)
 Last seen on 04/11/23 per note "  Return in about 9 months (around 01/11/2024).
